# Patient Record
Sex: Male | Born: 1955 | Race: White | Hispanic: No | Marital: Married | Smoking: Never smoker
Health system: Southern US, Community
[De-identification: ages and names within clinical notes are randomized; demographics above are authoritative.]

## PROBLEM LIST (undated history)

## (undated) DIAGNOSIS — Z8719 Personal history of other diseases of the digestive system: Secondary | ICD-10-CM

## (undated) DIAGNOSIS — E785 Hyperlipidemia, unspecified: Secondary | ICD-10-CM

## (undated) DIAGNOSIS — F988 Other specified behavioral and emotional disorders with onset usually occurring in childhood and adolescence: Secondary | ICD-10-CM

## (undated) DIAGNOSIS — I4510 Unspecified right bundle-branch block: Secondary | ICD-10-CM

## (undated) DIAGNOSIS — E7800 Pure hypercholesterolemia, unspecified: Secondary | ICD-10-CM

## (undated) HISTORY — PX: APPENDECTOMY: SHX54

## (undated) HISTORY — DX: Pure hypercholesterolemia, unspecified: E78.00

## (undated) HISTORY — PX: SHOULDER SURGERY: SHX246

## (undated) HISTORY — DX: Unspecified right bundle-branch block: I45.10

## (undated) HISTORY — DX: Hyperlipidemia, unspecified: E78.5

## (undated) HISTORY — DX: Other specified behavioral and emotional disorders with onset usually occurring in childhood and adolescence: F98.8

## (undated) HISTORY — DX: Personal history of other diseases of the digestive system: Z87.19

---

## 2016-06-09 DIAGNOSIS — S060X9A Concussion with loss of consciousness of unspecified duration, initial encounter: Secondary | ICD-10-CM | POA: Diagnosis not present

## 2016-07-01 DIAGNOSIS — E781 Pure hyperglyceridemia: Secondary | ICD-10-CM | POA: Diagnosis not present

## 2016-07-01 DIAGNOSIS — Z0000 Encounter for general adult medical examination without abnormal findings: Secondary | ICD-10-CM | POA: Diagnosis not present

## 2016-07-01 DIAGNOSIS — Z125 Encounter for screening for malignant neoplasm of prostate: Secondary | ICD-10-CM | POA: Diagnosis not present

## 2016-07-01 DIAGNOSIS — F900 Attention-deficit hyperactivity disorder, predominantly inattentive type: Secondary | ICD-10-CM | POA: Diagnosis not present

## 2016-07-01 DIAGNOSIS — Z1159 Encounter for screening for other viral diseases: Secondary | ICD-10-CM | POA: Diagnosis not present

## 2017-01-02 DIAGNOSIS — F900 Attention-deficit hyperactivity disorder, predominantly inattentive type: Secondary | ICD-10-CM | POA: Diagnosis not present

## 2017-02-27 DIAGNOSIS — L247 Irritant contact dermatitis due to plants, except food: Secondary | ICD-10-CM | POA: Diagnosis not present

## 2017-07-10 DIAGNOSIS — Z13220 Encounter for screening for lipoid disorders: Secondary | ICD-10-CM | POA: Diagnosis not present

## 2017-07-10 DIAGNOSIS — M79602 Pain in left arm: Secondary | ICD-10-CM | POA: Diagnosis not present

## 2017-07-10 DIAGNOSIS — Z0000 Encounter for general adult medical examination without abnormal findings: Secondary | ICD-10-CM | POA: Diagnosis not present

## 2017-07-11 ENCOUNTER — Encounter: Payer: Self-pay | Admitting: Cardiology

## 2017-07-12 ENCOUNTER — Encounter: Payer: Self-pay | Admitting: Cardiology

## 2017-07-12 ENCOUNTER — Ambulatory Visit: Payer: BLUE CROSS/BLUE SHIELD | Admitting: Cardiology

## 2017-07-12 ENCOUNTER — Encounter (INDEPENDENT_AMBULATORY_CARE_PROVIDER_SITE_OTHER): Payer: Self-pay

## 2017-07-12 VITALS — BP 104/70 | HR 67 | Ht 73.0 in | Wt 179.2 lb

## 2017-07-12 DIAGNOSIS — M79602 Pain in left arm: Secondary | ICD-10-CM | POA: Insufficient documentation

## 2017-07-12 DIAGNOSIS — E7800 Pure hypercholesterolemia, unspecified: Secondary | ICD-10-CM | POA: Diagnosis not present

## 2017-07-12 DIAGNOSIS — I4510 Unspecified right bundle-branch block: Secondary | ICD-10-CM | POA: Diagnosis not present

## 2017-07-12 DIAGNOSIS — E785 Hyperlipidemia, unspecified: Secondary | ICD-10-CM

## 2017-07-12 HISTORY — DX: Unspecified right bundle-branch block: I45.10

## 2017-07-12 HISTORY — DX: Hyperlipidemia, unspecified: E78.5

## 2017-07-12 NOTE — Patient Instructions (Signed)
Medication Instructions:  Your physician recommends that you continue on your current medications as directed. Please refer to the Current Medication list given to you today.  Labwork: None ordered  Testing/Procedures: Your physician has requested that you have an exercise tolerance test. For further information please visit [email or web address] Please also follow instruction sheet, as given.  Your physician has requested that you have an echocardiogram. Echocardiography is a painless test that uses sound waves to create images of your heart. It provides your doctor with information about the size and shape of your heart and how well your heart's chambers and valves are working. This procedure takes approximately one hour. There are no restrictions for this procedure.  Regular calcium score done here at our CT department   Follow-Up: Follow up as needed with Dr. Mayford Knifeurner.   Any Other Special Instructions Will Be Listed Below (If Applicable).     If you need a refill on your cardiac medications before your next appointment, please call your pharmacy.

## 2017-07-12 NOTE — Progress Notes (Signed)
Cardiology Office Note    Date:  07/12/2017   ID:  Marcus Harvey, DOB 01/10/1956, MRN [id number]  PCP:  Sigmund HazelMiller, Lisa, MD  Cardiologist:  Armanda Magicraci Turner, MD   Chief Complaint  Patient presents with  . New Patient (Initial Visit)    left arm pain    History of Present Illness:  Marcus KohWilliam Harvey is a 61 y.o. male who is being seen today for the evaluation of exertional left arm pain  at the request of Sigmund HazelLisa Miller MD.  The patient has a history of hyperlipidemia mainly hypertriglyceridemia, ADD and Raynauds and has been having problems with     Past Medical History:  Diagnosis Date  . ADD (attention deficit disorder)   . Hx of hemorrhoids   . Hypercholesterolemia    high TG 1/10, 1/12, 3/13, 3/15, high LDL, TG consider statin ASCVD risk 7-5%, usual risk 3.6%, risk 6%, LDL. 168 11/17, recheck 6 mos.   . Hyperlipidemia 07/12/2017  . Incomplete RBBB 07/12/2017    Past Surgical History:  Procedure Laterality Date  . APPENDECTOMY    . SHOULDER SURGERY Left    multiple dislocations     Current Medications: Current Meds  Medication Sig  . lisdexamfetamine (VYVANSE) 30 MG capsule Take 30 mg daily as needed by mouth (ADD).     Allergies:   Patient has no known allergies.   Social History   Socioeconomic History  . Marital status: Married    Spouse name: None  . Number of children: 2  . Years of education: college   . Highest education level: None  Social Needs  . Financial resource strain: None  . Food insecurity - worry: None  . Food insecurity - inability: None  . Transportation needs - medical: None  . Transportation needs - non-medical: None  Occupational History  . Occupation: Marketing executivesales contracting/business owner  Tobacco Use  . Smoking status: Never Smoker  . Smokeless tobacco: Never Used  Substance and Sexual Activity  . Alcohol use: Yes  . Drug use: No  . Sexual activity: None  Other Topics Concern  . None  Social History Narrative  . None      Family History:  The patient's family history includes Cancer (age of onset: 6053) in his brother; Diabetes Mellitus I in his paternal grandfather.   ROS:   Please see the history of present illness.    ROS All other systems reviewed and are negative.  No flowsheet data found.     PHYSICAL EXAM:   VS:  BP 104/70   Pulse 67   Ht 6\' 1"  (1.854 m)   Wt 179 lb 3.2 oz (81.3 kg)   BMI 23.64 kg/m    GEN: Well nourished, well developed, in no acute distress  HEENT: normal  Neck: no JVD, carotid bruits, or masses Cardiac: RRR; no murmurs, rubs, or gallops,no edema.  Intact distal pulses bilaterally.  Respiratory:  clear to auscultation bilaterally, normal work of breathing GI: soft, nontender, nondistended, + BS MS: no deformity or atrophy  Skin: warm and dry, no rash Neuro:  Alert and Oriented x 3, Strength and sensation are intact Psych: euthymic mood, full affect  Wt Readings from Last 3 Encounters:  07/12/17 179 lb 3.2 oz (81.3 kg)      Studies/Labs Reviewed:   EKG:  EKG is ordered today.  The ekg ordered today demonstrates NSR at 67bpm with IRBBB  Recent Labs: No results found for requested labs within last 8760 hours.   Lipid  Panel No results found for: CHOL, TRIG, HDL, CHOLHDL, VLDL, LDLCALC, LDLDIRECT  Additional studies/ records that were reviewed today include:  Office notes from PCP    ASSESSMENT:    1. Left arm pain   2. Right bundle branch block   3. Pure hypercholesterolemia      PLAN:  In order of problems listed above:  1. Left arm pain - unclear etiology.  He has had shoulder surgery with a pin in the past.  The concerning component is that it is exertional although no associated CP or SOB.  His EKG shows an IRBBB.  He does not smoke and has no family history of premature CAD but does have hyperlipidemia - I will get an ETT to rule out ischemia and get a Chest Ct calcium score to assess future risk.   2.  IRBBB - I will get a 2D echo to rule out  structural heart disease.  3.  Hyperlipidemia - total chol 256 with LDL 168.  His current Framingham 6163yr risk is 7% but if calcium score is elevated it will be > 7.5%. If calcium score is 0 then I have told him I am fine with him trying diet for 4-6 months and repeating lipids prior to starting statin as he knows the bad foods he is eating and wants to change his diet.  If he has significant coronary calcium then I would recommend that he start the statin.     Medication Adjustments/Labs and Tests Ordered: Current medicines are reviewed at length with the patient today.  Concerns regarding medicines are outlined above.  Medication changes, Labs and Tests ordered today are listed in the Patient Instructions below.  There are no Patient Instructions on file for this visit.   Signed, Armanda Magicraci Turner, MD  07/12/2017 2:49 PM    [facility] [address], [place], [place]  [address] Phone: [phone]; Fax: [phone]

## 2017-07-27 ENCOUNTER — Ambulatory Visit (HOSPITAL_COMMUNITY): Payer: BLUE CROSS/BLUE SHIELD | Attending: Cardiovascular Disease

## 2017-07-27 ENCOUNTER — Ambulatory Visit (INDEPENDENT_AMBULATORY_CARE_PROVIDER_SITE_OTHER)
Admission: RE | Admit: 2017-07-27 | Discharge: 2017-07-27 | Disposition: A | Discharge status: Home / Self Care | Payer: Self-pay | Source: Ambulatory Visit | Attending: Cardiology | Admitting: Cardiology

## 2017-07-27 ENCOUNTER — Other Ambulatory Visit: Payer: Self-pay

## 2017-07-27 ENCOUNTER — Ambulatory Visit (INDEPENDENT_AMBULATORY_CARE_PROVIDER_SITE_OTHER): Payer: BLUE CROSS/BLUE SHIELD

## 2017-07-27 DIAGNOSIS — I10 Essential (primary) hypertension: Secondary | ICD-10-CM | POA: Insufficient documentation

## 2017-07-27 DIAGNOSIS — Z8249 Family history of ischemic heart disease and other diseases of the circulatory system: Secondary | ICD-10-CM | POA: Diagnosis not present

## 2017-07-27 DIAGNOSIS — M79602 Pain in left arm: Secondary | ICD-10-CM | POA: Diagnosis not present

## 2017-07-27 DIAGNOSIS — I4510 Unspecified right bundle-branch block: Secondary | ICD-10-CM | POA: Insufficient documentation

## 2017-07-27 DIAGNOSIS — E7800 Pure hypercholesterolemia, unspecified: Secondary | ICD-10-CM

## 2017-07-27 DIAGNOSIS — E785 Hyperlipidemia, unspecified: Secondary | ICD-10-CM | POA: Diagnosis not present

## 2017-07-28 LAB — EXERCISE TOLERANCE TEST
CHL CUP MPHR: 161 {beats}/min
CHL CUP RESTING HR STRESS: 54 {beats}/min
CHL CUP STRESS STAGE 1 DBP: 87 mmHg
CHL CUP STRESS STAGE 1 GRADE: 0 %
CHL CUP STRESS STAGE 1 SPEED: 0 mph
CHL CUP STRESS STAGE 10 HR: 80 {beats}/min
CHL CUP STRESS STAGE 2 GRADE: 0 %
CHL CUP STRESS STAGE 2 HR: 67 {beats}/min
CHL CUP STRESS STAGE 3 GRADE: 0.1 %
CHL CUP STRESS STAGE 3 HR: 67 {beats}/min
CHL CUP STRESS STAGE 4 SPEED: 1.7 mph
CHL CUP STRESS STAGE 5 GRADE: 12 %
CHL CUP STRESS STAGE 5 HR: 95 {beats}/min
CHL CUP STRESS STAGE 5 SPEED: 2.5 mph
CHL CUP STRESS STAGE 6 DBP: 80 mmHg
CHL CUP STRESS STAGE 6 GRADE: 14 %
CHL CUP STRESS STAGE 6 HR: 107 {beats}/min
CHL CUP STRESS STAGE 6 SBP: 187 mmHg
CHL CUP STRESS STAGE 6 SPEED: 3.4 mph
CHL CUP STRESS STAGE 7 GRADE: 16 %
CHL CUP STRESS STAGE 7 HR: 125 {beats}/min
CHL CUP STRESS STAGE 7 SBP: 205 mmHg
CHL CUP STRESS STAGE 8 HR: 127 {beats}/min
CHL CUP STRESS STAGE 8 SPEED: 5 mph
CHL CUP STRESS STAGE 9 DBP: 99 mmHg
CHL CUP STRESS STAGE 9 SPEED: 0 mph
CSEPPMHR: 78 %
Estimated workload: 15.1 METS
Exercise duration (min): 12 min
Exercise duration (sec): 54 s
Peak HR: 127 {beats}/min
Percent HR: 93 %
RPE: 15
Stage 1 HR: 60 {beats}/min
Stage 1 SBP: 126 mmHg
Stage 10 DBP: 91 mmHg
Stage 10 Grade: 0 %
Stage 10 SBP: 155 mmHg
Stage 10 Speed: 0 mph
Stage 2 Speed: 1 mph
Stage 3 Speed: 1 mph
Stage 4 DBP: 81 mmHg
Stage 4 Grade: 10 %
Stage 4 HR: 81 {beats}/min
Stage 4 SBP: 134 mmHg
Stage 5 DBP: 84 mmHg
Stage 5 SBP: 159 mmHg
Stage 7 DBP: 98 mmHg
Stage 7 Speed: 4.2 mph
Stage 8 Grade: 18 %
Stage 9 Grade: 0 %
Stage 9 HR: 121 {beats}/min
Stage 9 SBP: 201 mmHg

## 2017-08-02 ENCOUNTER — Telehealth: Payer: Self-pay

## 2017-08-02 DIAGNOSIS — I719 Aortic aneurysm of unspecified site, without rupture: Secondary | ICD-10-CM

## 2017-08-02 DIAGNOSIS — I712 Thoracic aortic aneurysm, without rupture, unspecified: Secondary | ICD-10-CM

## 2017-08-02 NOTE — Progress Notes (Signed)
Normal stress test

## 2017-08-02 NOTE — Telephone Encounter (Signed)
Notes recorded by Phineas Semenobertson, Ozro Russett, RN on 08/02/2017 at 1:35 PM EST Reviewed results with patient and informed him that Dr. Mayford Knifeurner recommends a MRI/MRA of chest and abdomen for further assess of aortic aneurysm. Patient informed that he will be contacted to make that appointment. Patient verbalized understanding and thanked me for the call. Most recent copy of lipids on file.   Notes recorded by Quintella Reicherturner, Traci R, MD on 08/02/2017 at 5:02 AM EST Please let patient know that he has a moderated aortic aneurysm measuring 44mm- please get dedicated MRI/MRA of chest and abdomen to assess further- calcium score mildly elevated - please get copy of last lipids to review

## 2017-08-14 ENCOUNTER — Telehealth: Payer: Self-pay | Admitting: Cardiology

## 2017-08-14 NOTE — Telephone Encounter (Signed)
New message    Pt would like to switch providers from Dr. Mayford Knifeurner to Dr. Elease HashimotoNahser. Is this ok?

## 2017-08-14 NOTE — Telephone Encounter (Signed)
No problem I am fine with that

## 2017-08-14 NOTE — Telephone Encounter (Signed)
I've known Bill for many years. I would be happy to see him if OK with Dr. Mayford Knifeurner.

## 2017-08-16 ENCOUNTER — Telehealth: Payer: Self-pay | Admitting: *Deleted

## 2017-08-16 ENCOUNTER — Encounter: Payer: Self-pay | Admitting: *Deleted

## 2017-08-16 DIAGNOSIS — I712 Thoracic aortic aneurysm, without rupture, unspecified: Secondary | ICD-10-CM

## 2017-08-16 NOTE — Telephone Encounter (Signed)
Dr. Elease HashimotoNahser s/w pt and went over results by phone with verbal understanding. Pt agreeable to plan of care per Dr. Elease HashimotoNahser to schedule CT-A. I will place order and have CT dept call and schedule. Per Dr. Elease HashimotoNahser pt would like to have this done before Christmas.

## 2017-08-16 NOTE — Telephone Encounter (Signed)
This encounter was created in error - please disregard.

## 2017-08-17 ENCOUNTER — Ambulatory Visit (INDEPENDENT_AMBULATORY_CARE_PROVIDER_SITE_OTHER)
Admission: RE | Admit: 2017-08-17 | Discharge: 2017-08-17 | Disposition: A | Discharge status: Home / Self Care | Payer: BLUE CROSS/BLUE SHIELD | Source: Ambulatory Visit | Attending: Cardiovascular Disease | Admitting: Cardiovascular Disease

## 2017-08-17 DIAGNOSIS — I712 Thoracic aortic aneurysm, without rupture, unspecified: Secondary | ICD-10-CM

## 2017-08-17 MED ORDER — IOPAMIDOL (ISOVUE-370) INJECTION 76%
100.0000 mL | Freq: Once | INTRAVENOUS | Status: AC | PRN
  Administered 2017-08-17: 100 mL via INTRAVENOUS

## 2017-08-18 ENCOUNTER — Other Ambulatory Visit: Payer: BLUE CROSS/BLUE SHIELD

## 2017-09-04 ENCOUNTER — Encounter: Payer: Self-pay | Admitting: Cardiovascular Disease

## 2017-09-04 ENCOUNTER — Ambulatory Visit: Payer: BLUE CROSS/BLUE SHIELD | Admitting: Cardiovascular Disease

## 2017-09-04 VITALS — BP 132/68 | HR 72 | Ht 73.0 in | Wt 185.8 lb

## 2017-09-04 DIAGNOSIS — I7121 Aneurysm of the ascending aorta, without rupture: Secondary | ICD-10-CM

## 2017-09-04 DIAGNOSIS — E782 Mixed hyperlipidemia: Secondary | ICD-10-CM

## 2017-09-04 DIAGNOSIS — I712 Thoracic aortic aneurysm, without rupture: Secondary | ICD-10-CM | POA: Diagnosis not present

## 2017-09-04 MED ORDER — ATORVASTATIN CALCIUM 40 MG PO TABS: 40.0000 mg | ORAL_TABLET | Freq: Every day | ORAL | 3 refills | Status: DC

## 2017-09-04 NOTE — Patient Instructions (Addendum)
Medication Instructions:  Your physician has recommended you make the following change in your medication:  INCREASE Atorvastatin to 40 mg daily   Labwork: Your physician recommends that you return for lab work in: 3 months on Tuesday December 06, 2017 You will need to FAST for this appointment - nothing to eat or drink after midnight the night before except water.   Testing/Procedures: None Ordered   Follow-Up: Your physician wants you to follow-up in: 6 months with Dr. Elease HashimotoNahser.  You will receive a reminder letter in the mail two months in advance. If you don't receive a letter, please call our office to schedule the follow-up appointment.   If you need a refill on your cardiac medications before your next appointment, please call your pharmacy.   Thank you for choosing [facility]! Eligha BridegroomMichelle Vence Lalor, RN [phone]

## 2017-09-04 NOTE — Progress Notes (Signed)
Cardiology Office Note:    Date:  09/04/2017   ID:  Marcus Harvey, DOB 02/27/1956, MRN [id number]  PCP:  Sigmund HazelMiller, Lisa, MD  Cardiologist:  Kristeen MissPhilip Carlisha Wisler, MD    Referring MD: Sigmund HazelMiller, Lisa, MD   Problem list 1.  Left arm pain 2.  Hyperlipidemia 3.  Ascending aortic dilatation 4.  Right bundle branch block  Chief Complaint  Patient presents with  . Follow-up    ascending aortic dilitation  . Hyperlipidemia    History of Present Illness:    Marcus KohWilliam Harvey is a [age] y.o. male with a hx of hyperlipidemia.  He originally saw Dr. Mayford Knifeurner for an atypical episode of chest pain.  Coronary CT angiogram revealed a coronary calcium score of 38.  This is in the 56th percentile for age and sex.  He had dilatation of the ascending aorta (44 mm) .  Follow-up CT angiogram showed that the thoracic aortic aneurysm was 4.2 cm.  There was no evidence of dissection.  Fasting labs performed at his medical doctor's office reveals: Total cholesterol = 227 Triglyceride level = 186 HDL = 47 LDL = 142  Has noticed a feeling in his chest  - not a pain ,  Thinks it could be anxiety  Has not had   Past Medical History:  Diagnosis Date  . ADD (attention deficit disorder)   . Hx of hemorrhoids   . Hypercholesterolemia    high TG 1/10, 1/12, 3/13, 3/15, high LDL, TG consider statin ASCVD risk 7-5%, usual risk 3.6%, risk 6%, LDL. 168 11/17, recheck 6 mos.   . Hyperlipidemia 07/12/2017  . Incomplete RBBB 07/12/2017    Past Surgical History:  Procedure Laterality Date  . APPENDECTOMY    . SHOULDER SURGERY Left    multiple dislocations     Current Medications: Current Meds  Medication Sig  . atorvastatin (LIPITOR) 10 MG tablet Take 10 mg by mouth daily.  Marland Kitchen. lisdexamfetamine (VYVANSE) 30 MG capsule Take 30 mg daily as needed by mouth (ADD).      Allergies:   Patient has no known allergies.   Social History   Socioeconomic History  . Marital status: Married    Spouse name: None  . Number of  children: 2  . Years of education: college   . Highest education level: None  Social Needs  . Financial resource strain: None  . Food insecurity - worry: None  . Food insecurity - inability: None  . Transportation needs - medical: None  . Transportation needs - non-medical: None  Occupational History  . Occupation: Marketing executivesales contracting/business owner  Tobacco Use  . Smoking status: Never Smoker  . Smokeless tobacco: Never Used  Substance and Sexual Activity  . Alcohol use: Yes  . Drug use: No  . Sexual activity: None  Other Topics Concern  . None  Social History Narrative  . None     Family History: The patient's family history includes Cancer (age of onset: 453) in his brother; Diabetes Mellitus I in his paternal grandfather. ROS:   Please see the history of present illness.     All other systems reviewed and are negative.  EKGs/Labs/Other Studies Reviewed:       EKG:     Recent Labs: No results found for requested labs within last 8760 hours.  Recent Lipid Panel No results found for: CHOL, TRIG, HDL, CHOLHDL, VLDL, LDLCALC, LDLDIRECT  Physical Exam:    VS:  BP 132/68   Pulse 72   Ht 6\' 1"  (1.854  m)   Wt 185 lb 12.8 oz (84.3 kg)   SpO2 97%   BMI 24.51 kg/m     Wt Readings from Last 3 Encounters:  09/04/17 185 lb 12.8 oz (84.3 kg)  07/12/17 179 lb 3.2 oz (81.3 kg)     GEN:  Well nourished, well developed in no acute distress HEENT: Normal NECK: No JVD; No carotid bruits LYMPHATICS: No lymphadenopathy CARDIAC: RR  no murmurs, rubs, gallops RESPIRATORY:  Clear to auscultation without rales, wheezing or rhonchi  ABDOMEN: Soft, non-tender, non-distended MUSCULOSKELETAL:  No edema; No deformity  SKIN: Warm and dry NEUROLOGIC:  Alert and oriented x 3 PSYCHIATRIC:  Normal affect   ASSESSMENT:    No diagnosis found. PLAN:    In order of problems listed above:  1.   Ascending aortid dilitation - very minimal 4.2 cm  Will recheck in 12 months  Given him  the okay to exercise on a regular basis.  Of advised him to avoid weight lifting. Discussed low dose beta blocker .   He still eats some occasional salt.  We will have him decrease his salt intake.  2.  Hyperlipidemia-he has evidence of coronary artery calcifications.  I think he needs an LDL of 70 or below.  Will increase the atorvastatin to 40 mg a day and recheck labs in 3 months.  3. Coronary Calcium -he has evidence of coronary calcifications.  He has no angina.  Continue aggressive treatment of his lipids.  He will continue to exercise.   Medication Adjustments/Labs and Tests Ordered: Current medicines are reviewed at length with the patient today.  Concerns regarding medicines are outlined above.  No orders of the defined types were placed in this encounter.  No orders of the defined types were placed in this encounter.   Signed, Kristeen Miss, MD  09/04/2017 11:11 AM    [facility]

## 2017-12-06 ENCOUNTER — Other Ambulatory Visit: Payer: Self-pay | Admitting: Nurse Practitioner

## 2017-12-06 ENCOUNTER — Other Ambulatory Visit: Payer: BLUE CROSS/BLUE SHIELD | Admitting: *Deleted

## 2017-12-06 DIAGNOSIS — I712 Thoracic aortic aneurysm, without rupture: Secondary | ICD-10-CM | POA: Diagnosis not present

## 2017-12-06 DIAGNOSIS — E782 Mixed hyperlipidemia: Secondary | ICD-10-CM | POA: Diagnosis not present

## 2017-12-06 DIAGNOSIS — R739 Hyperglycemia, unspecified: Secondary | ICD-10-CM

## 2017-12-06 DIAGNOSIS — I7121 Aneurysm of the ascending aorta, without rupture: Secondary | ICD-10-CM

## 2017-12-06 LAB — BASIC METABOLIC PANEL
BUN/Creatinine Ratio: 14 (ref 9–20)
BUN: 13 mg/dL (ref 6–24)
CO2: 26 mmol/L (ref 20–29)
Calcium: 9.2 mg/dL (ref 8.7–10.2)
Chloride: 96 mmol/L (ref 96–106)
Creatinine, Ser: 0.91 mg/dL (ref 0.76–1.27)
GFR calc Af Amer: 106 mL/min/{1.73_m2} (ref >59)
GFR calc non Af Amer: 92 mL/min/{1.73_m2} (ref >59)
Glucose: 192 mg/dL — ABNORMAL HIGH (ref 65–99)
Potassium: 4.1 mmol/L (ref 3.5–5.2)
Sodium: 140 mmol/L (ref 134–144)

## 2017-12-06 LAB — LIPID PANEL
Chol/HDL Ratio: 3.1 ratio (ref 0.0–5.0)
Cholesterol, Total: 159 mg/dL (ref 100–199)
HDL: 52 mg/dL (ref >39)
LDL CALC: 72 mg/dL (ref 0–99)
Triglycerides: 177 mg/dL — ABNORMAL HIGH (ref 0–149)
VLDL CHOLESTEROL CAL: 35 mg/dL (ref 5–40)

## 2017-12-06 LAB — HEPATIC FUNCTION PANEL
ALBUMIN: 4.7 g/dL (ref 3.5–5.5)
ALT: 35 IU/L (ref 0–44)
AST: 35 IU/L (ref 0–40)
Alkaline Phosphatase: 84 IU/L (ref 39–117)
BILIRUBIN TOTAL: 0.7 mg/dL (ref 0.0–1.2)
BILIRUBIN, DIRECT: 0.21 mg/dL (ref 0.00–0.40)
Total Protein: 6.9 g/dL (ref 6.0–8.5)

## 2017-12-08 ENCOUNTER — Encounter (INDEPENDENT_AMBULATORY_CARE_PROVIDER_SITE_OTHER): Payer: Self-pay

## 2017-12-08 ENCOUNTER — Other Ambulatory Visit: Payer: BLUE CROSS/BLUE SHIELD

## 2017-12-08 DIAGNOSIS — R739 Hyperglycemia, unspecified: Secondary | ICD-10-CM

## 2017-12-09 LAB — HEMOGLOBIN A1C
ESTIMATED AVERAGE GLUCOSE: 137 mg/dL
HEMOGLOBIN A1C: 6.4 % — AB (ref 4.8–5.6)

## 2017-12-12 DIAGNOSIS — R7303 Prediabetes: Secondary | ICD-10-CM | POA: Diagnosis not present

## 2017-12-12 DIAGNOSIS — Z6826 Body mass index (BMI) 26.0-26.9, adult: Secondary | ICD-10-CM | POA: Diagnosis not present

## 2018-01-12 DIAGNOSIS — F900 Attention-deficit hyperactivity disorder, predominantly inattentive type: Secondary | ICD-10-CM | POA: Diagnosis not present

## 2018-01-12 DIAGNOSIS — R7303 Prediabetes: Secondary | ICD-10-CM | POA: Diagnosis not present

## 2018-01-12 DIAGNOSIS — E781 Pure hyperglyceridemia: Secondary | ICD-10-CM | POA: Diagnosis not present

## 2018-02-26 IMAGING — CT CT ANGIO CHEST
2 of 7 series · 19 of 46 positions shown · IV contrast (iopamidol)
Comparison: None.

CLINICAL DATA: Thoracic aortic aneurysm without rupture.

EXAM:
CT ANGIOGRAPHY CHEST WITH CONTRAST
TECHNIQUE: Multidetector CT imaging of the chest was performed using the
standard protocol during bolus administration of intravenous
contrast. Multiplanar CT image reconstructions and MIPs were
obtained to evaluate the vascular anatomy.
CONTRAST:  100mL O0XFS7-N3G IOPAMIDOL (O0XFS7-N3G) INJECTION 76%

[Series 4: aorta 3.0 i31f 2 · axial · 0.79mm/px · z∈[-357,-30]mm · 16 of 119 slices shown]
[im 5/119  lung]
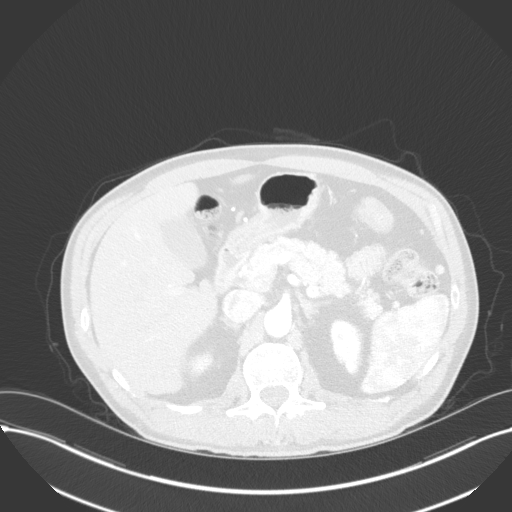
[im 14/119  soft-tissue]
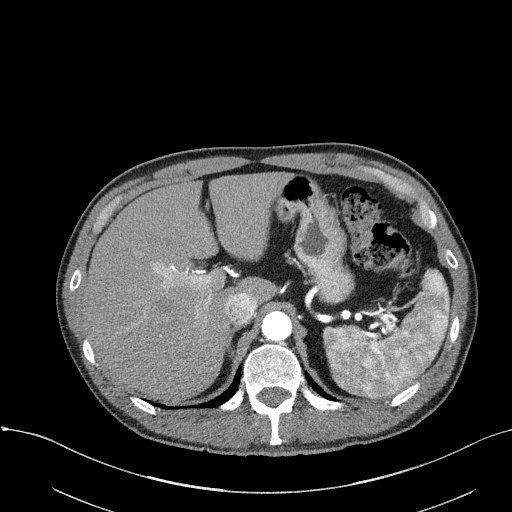
[im 22/119  lung]
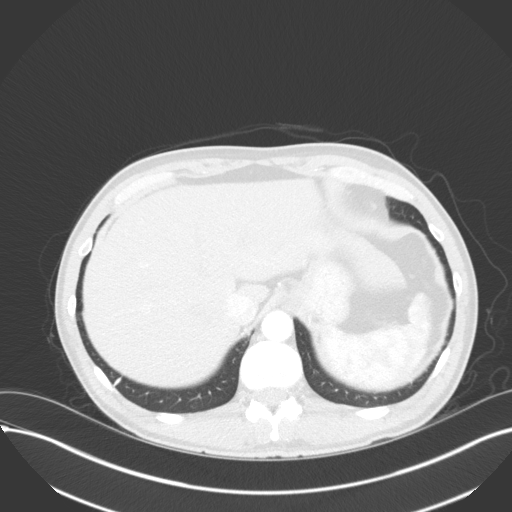
[im 27/119  soft-tissue]
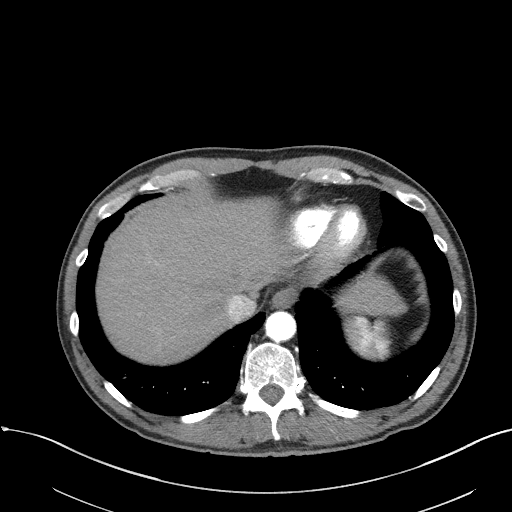
[im 35/119  lung]
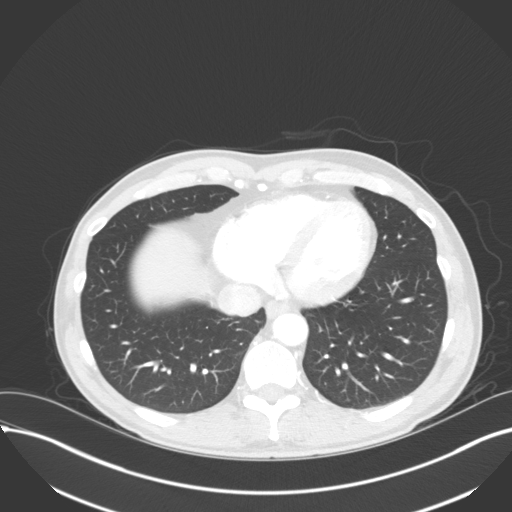
[im 40/119  soft-tissue]
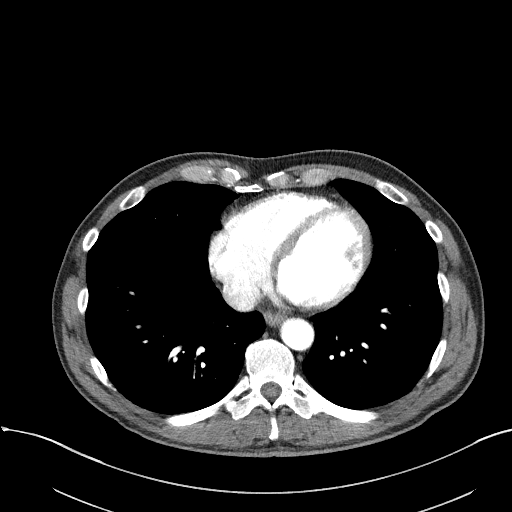
[im 49/119  lung]
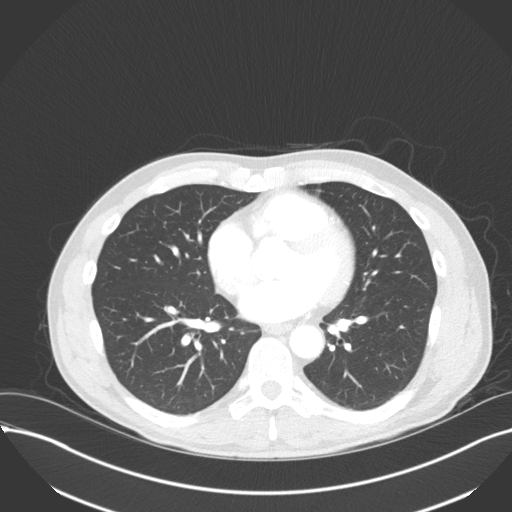
[im 57/119  soft-tissue]
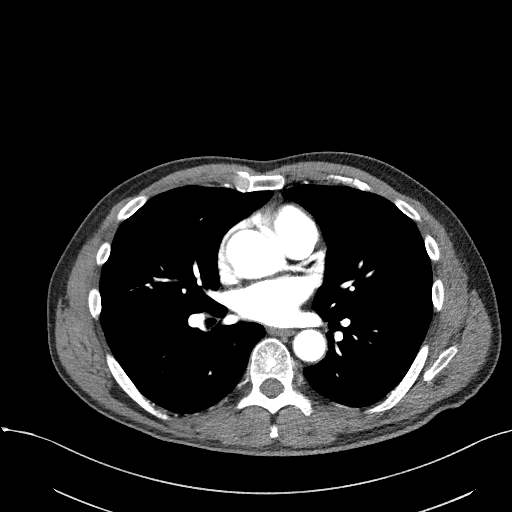
[im 62/119  lung]
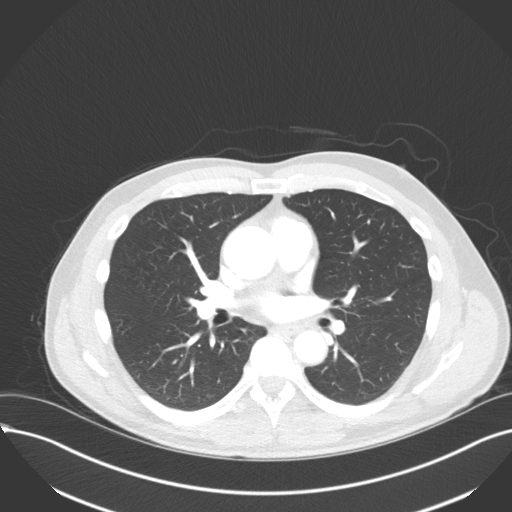
[im 70/119  soft-tissue]
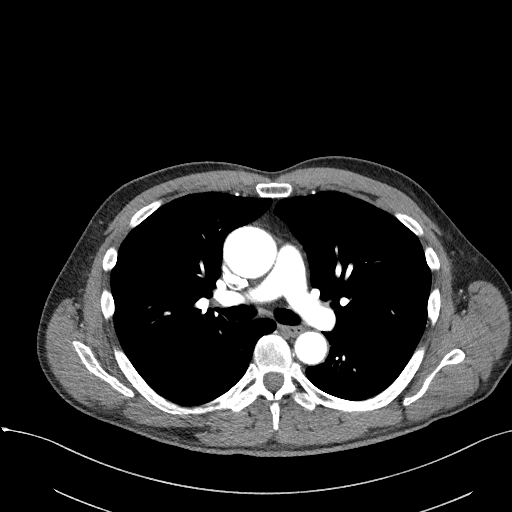
[im 79/119  lung]
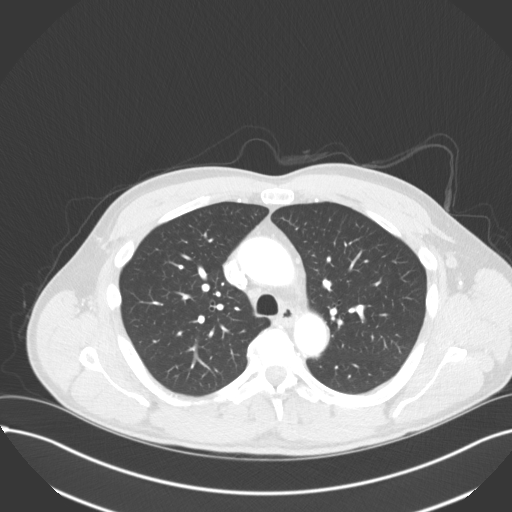
[im 84/119  soft-tissue]
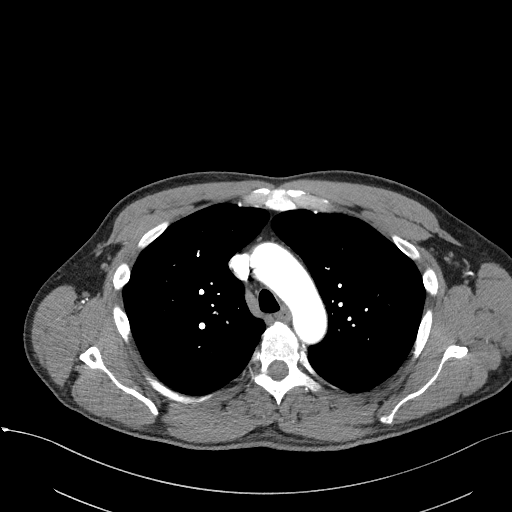
[im 92/119  lung]
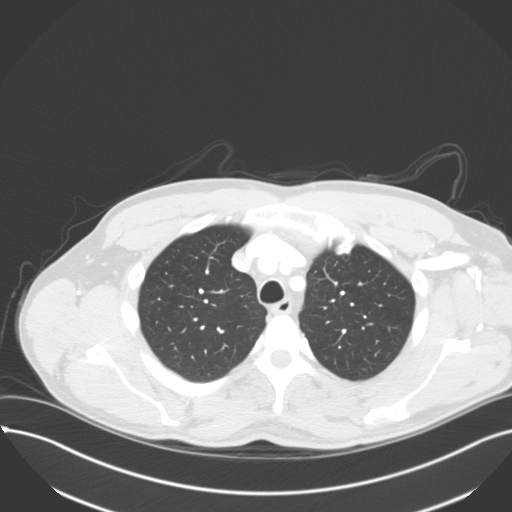
[im 97/119  soft-tissue]
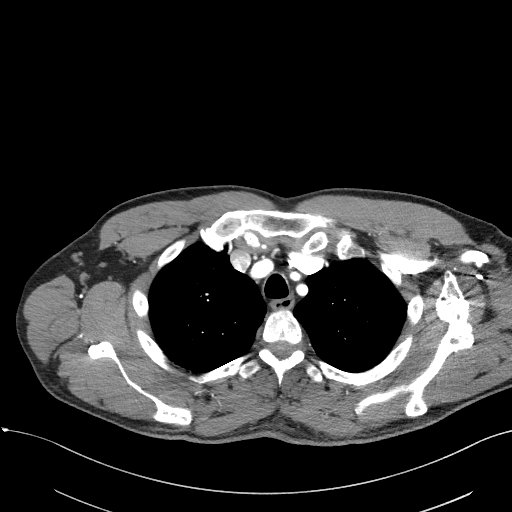
[im 105/119  lung]
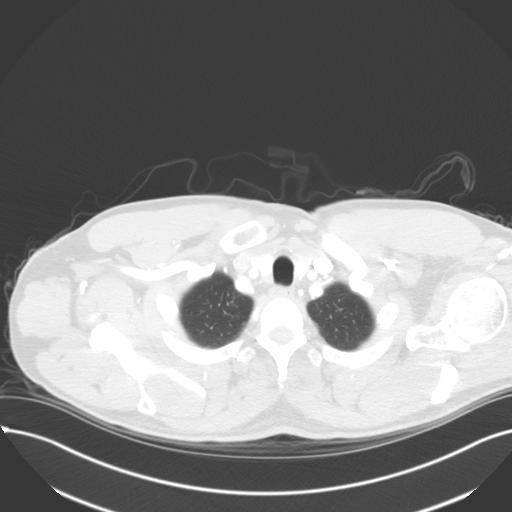
[im 114/119  soft-tissue]
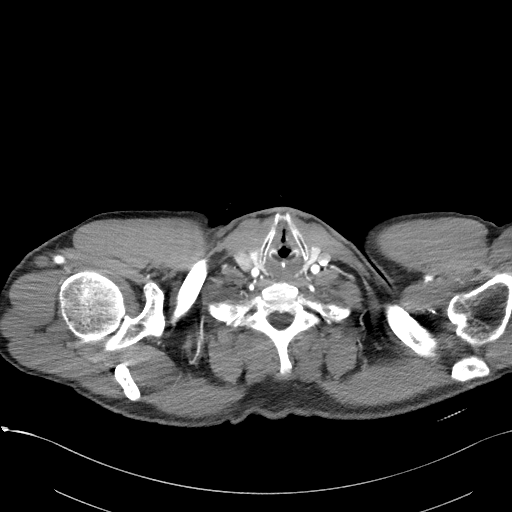

[Series 7: coronals · coronal · 0.67mm/px · 3 of 110 slices shown]
[im 28/110  soft-tissue]
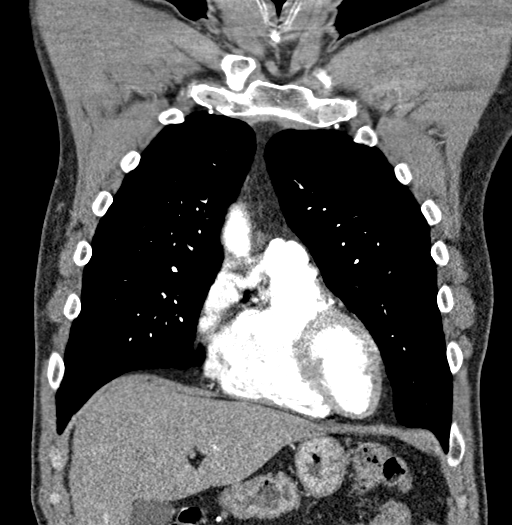
[im 55/110  soft-tissue]
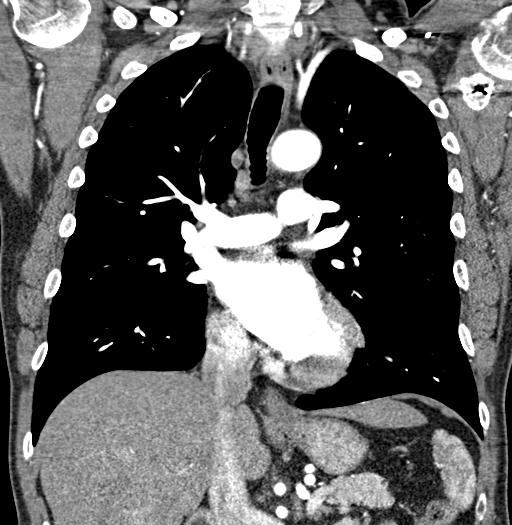
[im 82/110  soft-tissue]
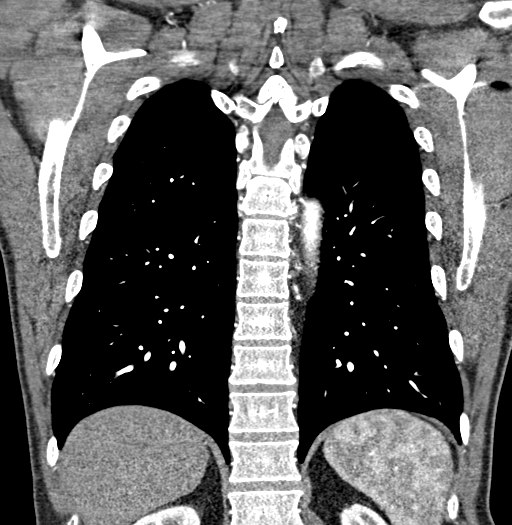

[19 of 46 positions shown; findings below may reference images not displayed]

FINDINGS: Cardiovascular: 4.2 cm ascending thoracic aortic aneurysm is noted
without dissection. Transverse aortic arch measures 2.8 cm in
diameter. Proximal descending thoracic aorta measures 2.5 cm. Great
vessels are widely patent without significant stenosis. Normal
cardiac size. No pericardial effusion is noted.

Mediastinum/Nodes: No enlarged mediastinal, hilar, or axillary lymph
nodes. Thyroid gland, trachea, and esophagus demonstrate no
significant findings.

Lungs/Pleura: Lungs are clear. No pleural effusion or pneumothorax.

Upper Abdomen: No acute abnormality.

Musculoskeletal: No chest wall abnormality. No acute or significant
osseous findings.

Review of the MIP images confirms the above findings.
IMPRESSION: 4.2 cm ascending thoracic aortic aneurysm. Recommend annual imaging
followup by CTA or MRA. This recommendation follows 8909
ACCF/AHA/AATS/ACR/ASA/SCA/MAREDIA/ROOS/KLEVER/GARNETT Guidelines for the
Diagnosis and Management of Patients with Thoracic Aortic Disease.
Circulation. 8909; 121: e266-e369.

## 2018-03-13 ENCOUNTER — Ambulatory Visit: Payer: BLUE CROSS/BLUE SHIELD | Admitting: Cardiovascular Disease

## 2018-03-13 ENCOUNTER — Encounter: Payer: Self-pay | Admitting: Cardiovascular Disease

## 2018-03-13 ENCOUNTER — Encounter (INDEPENDENT_AMBULATORY_CARE_PROVIDER_SITE_OTHER): Payer: Self-pay

## 2018-03-13 VITALS — BP 130/60 | HR 61 | Ht 73.0 in | Wt 179.0 lb

## 2018-03-13 DIAGNOSIS — E782 Mixed hyperlipidemia: Secondary | ICD-10-CM | POA: Diagnosis not present

## 2018-03-13 DIAGNOSIS — I712 Thoracic aortic aneurysm, without rupture, unspecified: Secondary | ICD-10-CM

## 2018-03-13 MED ORDER — FENOFIBRATE 145 MG PO TABS: 145.0000 mg | ORAL_TABLET | Freq: Every day | ORAL | 3 refills | Status: DC

## 2018-03-13 NOTE — Progress Notes (Signed)
Cardiology Office Note:    Date:  03/13/2018   ID:  Marcus Harvey, DOB 12/04/1955, MRN [id number]  PCP:  Sigmund HazelMiller, Lisa, MD  Cardiologist:  Kristeen MissPhilip Harshith Pursell, MD    Referring MD: Sigmund HazelMiller, Lisa, MD   Problem list 1.  Left arm pain 2.  Hyperlipidemia 3.  Ascending aortic dilatation 4.  Right bundle branch block  No chief complaint on file.   History of Present Illness:    Marcus Harvey is a 62 y.o. male with a hx of hyperlipidemia.  He originally saw Dr. Mayford Knifeurner for an atypical episode of chest pain.  Coronary CT angiogram revealed a coronary calcium score of 38.  This is in the 56th percentile for age and sex.  He had dilatation of the ascending aorta (44 mm) .  Follow-up CT angiogram showed that the thoracic aortic aneurysm was 4.2 cm.  There was no evidence of dissection.  Fasting labs performed at his medical doctor's office reveals: Total cholesterol = 227 Triglyceride level = 186 HDL = 47 LDL = 142  Has noticed a feeling in his chest  - not a pain ,  Thinks it could be anxiety  Has not had   March 13, 2018: Seen today for follow-up of his hyperlipidemia and mild aortic dilatation, and right bundle branch block. Marcus Harvey is done well.  Is not had any symptoms of chest pain or shortness of breath.  He continues to exercise and is very active.  Echocardiogram performed November, 2018 reveals normal left ventricular systolic function.  EF 60 to 65%.  He has grade 1 diastolic dysfunction. He had a regular treadmill test during which he walked for 12 minutes and 54 seconds.  He had frequent premature ventricular contractions and a 5 beat run of nonsustained VT.  Otherwise the exercise test was negative. Chest CT showed a small 4.2 cm thoracic aortic aneurysm . Recommended repeat in 12 months   Exercising without any issues    Past Medical History:  Diagnosis Date  . ADD (attention deficit disorder)   . Hx of hemorrhoids   . Hypercholesterolemia    high TG 1/10, 1/12, 3/13, 3/15,  high LDL, TG consider statin ASCVD risk 7-5%, usual risk 3.6%, risk 6%, LDL. 168 11/17, recheck 6 mos.   . Hyperlipidemia 07/12/2017  . Incomplete RBBB 07/12/2017    Past Surgical History:  Procedure Laterality Date  . APPENDECTOMY    . SHOULDER SURGERY Left    multiple dislocations     Current Medications: Current Meds  Medication Sig  . atorvastatin (LIPITOR) 40 MG tablet Take 1 tablet (40 mg total) by mouth daily.     Allergies:   Patient has no known allergies.   Social History   Socioeconomic History  . Marital status: Married    Spouse name: Not on file  . Number of children: 2  . Years of education: college   . Highest education level: Not on file  Occupational History  . Occupation: Marketing executivesales contracting/business owner  Social Needs  . Financial resource strain: Not on file  . Food insecurity:    Worry: Not on file    Inability: Not on file  . Transportation needs:    Medical: Not on file    Non-medical: Not on file  Tobacco Use  . Smoking status: Never Smoker  . Smokeless tobacco: Never Used  Substance and Sexual Activity  . Alcohol use: Yes  . Drug use: No  . Sexual activity: Not on file  Lifestyle  .  Physical activity:    Days per week: Not on file    Minutes per session: Not on file  . Stress: Not on file  Relationships  . Social connections:    Talks on phone: Not on file    Gets together: Not on file    Attends religious service: Not on file    Active member of club or organization: Not on file    Attends meetings of clubs or organizations: Not on file    Relationship status: Not on file  Other Topics Concern  . Not on file  Social History Narrative  . Not on file     Family History: The patient's family history includes Cancer (age of onset: 67) in his brother; Diabetes Mellitus I in his paternal grandfather. ROS:   Please see the history of present illness.     All other systems reviewed and are negative.  EKGs/Labs/Other Studies  Reviewed:       EKG:     Recent Labs: 12/06/2017: ALT 35; BUN 13; Creatinine, Ser 0.91; Potassium 4.1; Sodium 140  Recent Lipid Panel    Component Value Date/Time   CHOL 159 12/06/2017 0752   TRIG 177 (H) 12/06/2017 0752   HDL 52 12/06/2017 0752   CHOLHDL 3.1 12/06/2017 0752   LDLCALC 72 12/06/2017 0752    Physical Exam:    Physical Exam: Blood pressure 130/60, pulse 61, height 6\' 1"  (1.854 m), weight 179 lb (81.2 kg), SpO2 99 %.  GEN:  Well nourished, well developed in no acute distress HEENT: Normal NECK: No JVD; No carotid bruits LYMPHATICS: No lymphadenopathy CARDIAC: RRR RESPIRATORY:  Clear to auscultation without rales, wheezing or rhonchi  ABDOMEN: Soft, non-tender, non-distended MUSCULOSKELETAL:  No edema; No deformity  SKIN: Warm and dry NEUROLOGIC:  Alert and oriented x 3  ASSESSMENT:    1. Mixed hyperlipidemia   2. Thoracic aortic aneurysm without rupture (HCC)    PLAN:    In order of problems listed above:  1.   Ascending aortid dilitation - very minimal 4.2 cm  His blood pressure and heart rate are well controlled.  I advised him not to do any heavy weightlifting.  He cycles on a regular basis. Chest CT angiogram in 6 months.  I will see him in the office 1 week later.  2.  Hyperlipidemia-LDL is 72 on current dose of Lipitor 40 mg a day.  Triglyceride level is still elevated at  177.  Add fenofibrate 145 mg a day  Check fasting lipids in 3 months.  3. Coronary Calcium -.   Continue  aggressive lipid-lowering.   Medication Adjustments/Labs and Tests Ordered: Current medicines are reviewed at length with the patient today.  Concerns regarding medicines are outlined above.  Orders Placed This Encounter  Procedures  . CT ANGIO CHEST AORTA W &/OR WO CONTRAST  . Lipid Profile  . Basic Metabolic Panel (BMET)  . Hepatic function panel   Meds ordered this encounter  Medications  . fenofibrate (TRICOR) 145 MG tablet    Sig: Take 1 tablet (145 mg  total) by mouth daily.    Dispense:  90 tablet    Refill:  3    Signed, Kristeen Miss, MD  03/13/2018 8:41 AM     [facility]

## 2018-03-13 NOTE — Patient Instructions (Signed)
Medication Instructions:  Your physician has recommended you make the following change in your medication:   START Fenofibrate (Tricor) 145 mg once daily   Labwork: Your physician recommends that you return for lab work in: 3 months  You will need to FAST for this appointment - nothing to eat or drink after midnight the night before except water.   Testing/Procedures: Non-Cardiac CT Angiography (CTA), is a special type of CT scan that uses a computer to produce multi-dimensional views of major blood vessels throughout the body. In CT angiography, a contrast material is injected through an IV to help visualize the blood vessels **in 6 months before your visit with Dr. Elease HashimotoNahser   Follow-Up: Your physician wants you to follow-up in: 6 months with Dr. Elease HashimotoNahser. You will receive a reminder letter in the mail two months in advance. If you don't receive a letter, please call our office to schedule the follow-up appointment.   If you need a refill on your cardiac medications before your next appointment, please call your pharmacy.   Thank you for choosing [facility]! Eligha BridegroomMichelle Swinyer, RN [phone]

## 2018-03-16 ENCOUNTER — Telehealth: Payer: Self-pay | Admitting: Cardiovascular Disease

## 2018-03-16 ENCOUNTER — Telehealth: Payer: Self-pay | Admitting: Nurse Practitioner

## 2018-03-16 NOTE — Telephone Encounter (Signed)
-----   Message from Vesta MixerPhilip J Nahser, MD sent at 03/15/2018  4:23 PM EDT ----- Please order a CT angio of the chest to view the ascending aorta. To follow up with his previous CT scan 9 months ago that found an aortic aneurism of 4.2 cm .  Thanks  Dole FoodPhil

## 2018-03-16 NOTE — Telephone Encounter (Signed)
Patient scheduled for CT on July 25

## 2018-03-16 NOTE — Telephone Encounter (Signed)
New Message        Patient was told to call you to get a  a CT scheduled per Dr. Elease HashimotoNahser, pls call.

## 2018-03-16 NOTE — Telephone Encounter (Signed)
Message forwarded to [place] CT to be rescheduled

## 2018-03-16 NOTE — Telephone Encounter (Signed)
Patient's CT angio has been reschedules for July 25 with patient by our CT department

## 2018-03-19 ENCOUNTER — Other Ambulatory Visit: Payer: BLUE CROSS/BLUE SHIELD | Admitting: *Deleted

## 2018-03-19 DIAGNOSIS — I712 Thoracic aortic aneurysm, without rupture, unspecified: Secondary | ICD-10-CM

## 2018-03-19 DIAGNOSIS — E782 Mixed hyperlipidemia: Secondary | ICD-10-CM

## 2018-03-19 LAB — HEPATIC FUNCTION PANEL
ALBUMIN: 4.7 g/dL (ref 3.5–5.5)
ALK PHOS: 69 IU/L (ref 39–117)
ALT: 24 IU/L (ref 0–44)
AST: 29 IU/L (ref 0–40)
Bilirubin Total: 0.6 mg/dL (ref 0.0–1.2)
Bilirubin, Direct: 0.18 mg/dL (ref 0.00–0.40)
Total Protein: 7 g/dL (ref 6.0–8.5)

## 2018-03-19 LAB — BASIC METABOLIC PANEL
BUN/Creatinine Ratio: 17 (ref 9–20)
BUN: 16 mg/dL (ref 6–24)
CO2: 25 mmol/L (ref 20–29)
CREATININE: 0.92 mg/dL (ref 0.76–1.27)
Calcium: 9.6 mg/dL (ref 8.7–10.2)
Chloride: 100 mmol/L (ref 96–106)
GFR calc Af Amer: 105 mL/min/{1.73_m2} (ref >59)
GFR calc non Af Amer: 91 mL/min/{1.73_m2} (ref >59)
GLUCOSE: 141 mg/dL — AB (ref 65–99)
Potassium: 4.1 mmol/L (ref 3.5–5.2)
Sodium: 142 mmol/L (ref 134–144)

## 2018-03-19 LAB — LIPID PANEL
CHOL/HDL RATIO: 2.6 ratio (ref 0.0–5.0)
Cholesterol, Total: 168 mg/dL (ref 100–199)
HDL: 65 mg/dL (ref >39)
LDL Calculated: 82 mg/dL (ref 0–99)
Triglycerides: 105 mg/dL (ref 0–149)
VLDL CHOLESTEROL CAL: 21 mg/dL (ref 5–40)

## 2018-03-22 ENCOUNTER — Inpatient Hospital Stay: Admission: RE | Admit: 2018-03-22 | Payer: BLUE CROSS/BLUE SHIELD | Source: Ambulatory Visit

## 2018-03-22 ENCOUNTER — Ambulatory Visit (INDEPENDENT_AMBULATORY_CARE_PROVIDER_SITE_OTHER)
Admission: RE | Admit: 2018-03-22 | Discharge: 2018-03-22 | Disposition: A | Discharge status: Home / Self Care | Payer: BLUE CROSS/BLUE SHIELD | Source: Ambulatory Visit | Attending: Cardiovascular Disease | Admitting: Cardiovascular Disease

## 2018-03-22 DIAGNOSIS — I712 Thoracic aortic aneurysm, without rupture, unspecified: Secondary | ICD-10-CM

## 2018-03-22 MED ORDER — IOPAMIDOL (ISOVUE-370) INJECTION 76%
100.0000 mL | Freq: Once | INTRAVENOUS | Status: AC | PRN
  Administered 2018-03-22: 80 mL via INTRAVENOUS

## 2018-03-23 ENCOUNTER — Other Ambulatory Visit: Payer: BLUE CROSS/BLUE SHIELD

## 2018-03-26 ENCOUNTER — Other Ambulatory Visit: Payer: Self-pay | Admitting: Nurse Practitioner

## 2018-03-26 DIAGNOSIS — I712 Thoracic aortic aneurysm, without rupture, unspecified: Secondary | ICD-10-CM

## 2018-03-28 DIAGNOSIS — R7303 Prediabetes: Secondary | ICD-10-CM | POA: Diagnosis not present

## 2018-06-13 ENCOUNTER — Other Ambulatory Visit: Payer: BLUE CROSS/BLUE SHIELD

## 2018-06-15 ENCOUNTER — Other Ambulatory Visit: Payer: BLUE CROSS/BLUE SHIELD | Admitting: *Deleted

## 2018-06-15 ENCOUNTER — Other Ambulatory Visit: Payer: Self-pay

## 2018-06-15 ENCOUNTER — Encounter (INDEPENDENT_AMBULATORY_CARE_PROVIDER_SITE_OTHER): Payer: Self-pay

## 2018-06-15 DIAGNOSIS — E782 Mixed hyperlipidemia: Secondary | ICD-10-CM | POA: Diagnosis not present

## 2018-06-15 LAB — LIPID PANEL
CHOL/HDL RATIO: 2.7 ratio (ref 0.0–5.0)
Cholesterol, Total: 170 mg/dL (ref 100–199)
HDL: 62 mg/dL (ref >39)
LDL Calculated: 89 mg/dL (ref 0–99)
Triglycerides: 93 mg/dL (ref 0–149)
VLDL Cholesterol Cal: 19 mg/dL (ref 5–40)

## 2018-06-15 LAB — HEPATIC FUNCTION PANEL
ALT: 19 IU/L (ref 0–44)
AST: 30 IU/L (ref 0–40)
Albumin: 4.9 g/dL — ABNORMAL HIGH (ref 3.6–4.8)
Alkaline Phosphatase: 55 IU/L (ref 39–117)
BILIRUBIN TOTAL: 0.7 mg/dL (ref 0.0–1.2)
BILIRUBIN, DIRECT: 0.21 mg/dL (ref 0.00–0.40)
TOTAL PROTEIN: 7.3 g/dL (ref 6.0–8.5)

## 2018-08-07 DIAGNOSIS — R7303 Prediabetes: Secondary | ICD-10-CM | POA: Diagnosis not present

## 2018-08-07 DIAGNOSIS — Z125 Encounter for screening for malignant neoplasm of prostate: Secondary | ICD-10-CM | POA: Diagnosis not present

## 2018-08-07 DIAGNOSIS — Z79899 Other long term (current) drug therapy: Secondary | ICD-10-CM | POA: Diagnosis not present

## 2018-08-07 DIAGNOSIS — Z0000 Encounter for general adult medical examination without abnormal findings: Secondary | ICD-10-CM | POA: Diagnosis not present

## 2018-08-07 DIAGNOSIS — Z23 Encounter for immunization: Secondary | ICD-10-CM | POA: Diagnosis not present

## 2018-09-13 ENCOUNTER — Other Ambulatory Visit: Payer: BLUE CROSS/BLUE SHIELD

## 2018-09-14 ENCOUNTER — Other Ambulatory Visit: Payer: Self-pay | Admitting: Cardiovascular Disease

## 2018-11-27 DIAGNOSIS — I2510 Atherosclerotic heart disease of native coronary artery without angina pectoris: Secondary | ICD-10-CM | POA: Diagnosis not present

## 2018-11-27 DIAGNOSIS — I712 Thoracic aortic aneurysm, without rupture: Secondary | ICD-10-CM | POA: Diagnosis not present

## 2018-11-27 DIAGNOSIS — E1169 Type 2 diabetes mellitus with other specified complication: Secondary | ICD-10-CM | POA: Diagnosis not present

## 2018-11-29 DIAGNOSIS — E1169 Type 2 diabetes mellitus with other specified complication: Secondary | ICD-10-CM | POA: Diagnosis not present

## 2018-11-29 DIAGNOSIS — I2510 Atherosclerotic heart disease of native coronary artery without angina pectoris: Secondary | ICD-10-CM | POA: Diagnosis not present

## 2019-03-27 ENCOUNTER — Other Ambulatory Visit: Payer: Self-pay

## 2019-03-27 ENCOUNTER — Other Ambulatory Visit: Payer: Self-pay | Admitting: *Deleted

## 2019-03-27 DIAGNOSIS — Z01812 Encounter for preprocedural laboratory examination: Secondary | ICD-10-CM

## 2019-03-27 DIAGNOSIS — I712 Thoracic aortic aneurysm, without rupture, unspecified: Secondary | ICD-10-CM

## 2019-03-27 LAB — BASIC METABOLIC PANEL
BUN/Creatinine Ratio: 16 (ref 10–24)
BUN: 17 mg/dL (ref 8–27)
CO2: 30 mmol/L — ABNORMAL HIGH (ref 20–29)
Calcium: 9.8 mg/dL (ref 8.6–10.2)
Chloride: 101 mmol/L (ref 96–106)
Creatinine, Ser: 1.09 mg/dL (ref 0.76–1.27)
GFR calc Af Amer: 85 mL/min/{1.73_m2} (ref >59)
GFR calc non Af Amer: 73 mL/min/{1.73_m2} (ref >59)
Glucose: 186 mg/dL — ABNORMAL HIGH (ref 65–99)
Potassium: 4 mmol/L (ref 3.5–5.2)
Sodium: 136 mmol/L (ref 134–144)

## 2019-03-28 ENCOUNTER — Ambulatory Visit (INDEPENDENT_AMBULATORY_CARE_PROVIDER_SITE_OTHER)
Admission: RE | Admit: 2019-03-28 | Discharge: 2019-03-28 | Disposition: A | Discharge status: Home / Self Care | Payer: BC Managed Care – PPO | Source: Ambulatory Visit | Attending: Cardiovascular Disease | Admitting: Cardiovascular Disease

## 2019-03-28 ENCOUNTER — Other Ambulatory Visit: Payer: Self-pay

## 2019-03-28 DIAGNOSIS — I712 Thoracic aortic aneurysm, without rupture, unspecified: Secondary | ICD-10-CM

## 2019-03-28 MED ORDER — IOHEXOL 350 MG/ML SOLN
100.0000 mL | Freq: Once | INTRAVENOUS | Status: AC | PRN
  Administered 2019-03-28: 100 mL via INTRAVENOUS

## 2019-03-29 ENCOUNTER — Telehealth: Payer: Self-pay | Admitting: Nurse Practitioner

## 2019-03-29 NOTE — Telephone Encounter (Signed)
-----   Message from Thayer Headings, MD sent at 03/28/2019  2:59 PM EDT ----- Mild dilitation of the ascending aorta.  No changes from previous study  Will see him at his regularly scheduled appt.  And anticipate repeating the CT angio in a year

## 2019-03-29 NOTE — Telephone Encounter (Signed)
Reviewed CT results with patient who verbalized understanding. He was grateful for the good news.  Patient scheduled for annual follow-up with Dr. Elease HashimotoNahser on August 17. He verbalized understanding and agreement with instructions for virtual appointment.   YOUR CARDIOLOGY TEAM HAS ARRANGED FOR AN E-VISIT FOR YOUR APPOINTMENT - PLEASE REVIEW IMPORTANT INFORMATION BELOW SEVERAL DAYS PRIOR TO YOUR APPOINTMENT  Due to the recent COVID-19 pandemic, we are transitioning in-person office visits to tele-medicine visits in an effort to decrease unnecessary exposure to our patients, their families, and staff. These visits are billed to your insurance just like a normal visit is. We also encourage you to sign up for MyChart if you have not already done so. You will need a smartphone if possible. For patients that do not have this, we can still complete the visit using a regular telephone but do prefer a smartphone to enable video when possible. You may have a family member that lives with you that can help. If possible, we also ask that you have a blood pressure cuff and scale at home to measure your blood pressure, heart rate and weight prior to your scheduled appointment. Patients with clinical needs that need an in-person evaluation and testing will still be able to come to the office if absolutely necessary. If you have any questions, feel free to call our office.   YOUR PROVIDER WILL BE USING THE FOLLOWING PLATFORM TO COMPLETE YOUR VISIT: Doxy.Me   . IF USING DOXIMITY or DOXY.ME - The staff will give you instructions on receiving your link to join the meeting the day of your visit.    2-3 DAYS BEFORE YOUR APPOINTMENT  You will receive a telephone call from one of our [facility] team members - your caller ID may say "Unknown caller." If this is a video visit, we will walk you through how to get the video launched on your phone. We will remind you check your blood pressure, heart rate and weight prior to your  scheduled appointment. If you have an Apple Watch or Kardia, please upload any pertinent ECG strips the day before or morning of your appointment to MyChart. Our staff will also make sure you have reviewed the consent and agree to move forward with your scheduled tele-health visit.    THE DAY OF YOUR APPOINTMENT  Approximately 15 minutes prior to your scheduled appointment, you will receive a telephone call from one of HeartCare team - your caller ID may say "Unknown caller."  Our staff will confirm medications, vital signs for the day and any symptoms you may be experiencing. Please have this information available prior to the time of visit start. It may also be helpful for you to have a pad of paper and pen handy for any instructions given during your visit. They will also walk you through joining the smartphone meeting if this is a video visit.  CONSENT FOR TELE-HEALTH VISIT - PLEASE REVIEW  I hereby voluntarily request, consent and authorize [facility] and its employed or contracted physicians, physician assistants, nurse practitioners or other licensed health care professionals (the Practitioner), to provide me with telemedicine health care services (the "Services") as deemed necessary by the treating Practitioner. I acknowledge and consent to receive the Services by the Practitioner via telemedicine. I understand that the telemedicine visit will involve communicating with the Practitioner through live audiovisual communication technology and the disclosure of certain medical information by electronic transmission. I acknowledge that I have been given the opportunity to request an in-person assessment  or other available alternative prior to the telemedicine visit and am voluntarily participating in the telemedicine visit.  I understand that I have the right to withhold or withdraw my consent to the use of telemedicine in the course of my care at any time, without affecting my right to future care  or treatment, and that the Practitioner or I may terminate the telemedicine visit at any time. I understand that I have the right to inspect all information obtained and/or recorded in the course of the telemedicine visit and may receive copies of available information for a reasonable fee.  I understand that some of the potential risks of receiving the Services via telemedicine include:  Marland Kitchen Delay or interruption in medical evaluation due to technological equipment failure or disruption; . Information transmitted may not be sufficient (e.g. poor resolution of images) to allow for appropriate medical decision making by the Practitioner; and/or  . In rare instances, security protocols could fail, causing a breach of personal health information.  Furthermore, I acknowledge that it is my responsibility to provide information about my medical history, conditions and care that is complete and accurate to the best of my ability. I acknowledge that Practitioner's advice, recommendations, and/or decision may be based on factors not within their control, such as incomplete or inaccurate data provided by me or distortions of diagnostic images or specimens that may result from electronic transmissions. I understand that the practice of medicine is not an exact science and that Practitioner makes no warranties or guarantees regarding treatment outcomes. I acknowledge that I will receive a copy of this consent concurrently upon execution via email to the email address I last provided but may also request a printed copy by calling the office of [place].    I understand that my insurance will be billed for this visit.   I have read or had this consent read to me. . I understand the contents of this consent, which adequately explains the benefits and risks of the Services being provided via telemedicine.  . I have been provided ample opportunity to ask questions regarding this consent and the Services and have had  my questions answered to my satisfaction. . I give my informed consent for the services to be provided through the use of telemedicine in my medical care  By participating in this telemedicine visit I agree to the above.

## 2019-04-11 ENCOUNTER — Telehealth: Payer: Self-pay

## 2019-04-11 NOTE — Telephone Encounter (Signed)
Medications reviewed and appt confirmed for phone visit.

## 2019-04-14 NOTE — Progress Notes (Signed)
Virtual Visit via Video Note   This visit type was conducted due to national recommendations for restrictions regarding the COVID-19 Pandemic (e.g. social distancing) in an effort to limit this patient's exposure and mitigate transmission in our community.  Due to his co-morbid illnesses, this patient is at least at moderate risk for complications without adequate follow up.  This format is felt to be most appropriate for this patient at this time.  All issues noted in this document were discussed and addressed.  A limited physical exam was performed with this format.  Please refer to the patient's chart for his consent to telehealth for Marcus Harvey.   Date:  04/14/2019   ID:  Marcus Harvey, DOB 10/08/1955, MRN [id number]  Patient Location: Home Provider Location: Home  PCP:  Marcus HazelMiller, Lisa, MD  Cardiologist:  Marcus MissPhilip Diamonique Ruedas, MD  Electrophysiologist:  None   Problem list 1.  Left arm pain 2.  Hyperlipidemia 3.  Ascending aortic dilatation 4.  Right bundle branch block  No chief complaint on file.       Marcus Harvey is a 63 y.o. male with a hx of hyperlipidemia.  He originally saw Dr. Mayford Knifeurner for an atypical episode of chest pain.  Coronary CT angiogram revealed a coronary calcium score of 38.  This is in the 56th percentile for age and sex.  He had dilatation of the ascending aorta (44 mm) .  Follow-up CT angiogram showed that the thoracic aortic aneurysm was 4.2 cm.  There was no evidence of dissection.  Fasting labs performed at his medical doctor's office reveals: Total cholesterol = 227 Triglyceride level = 186 HDL = 47 LDL = 142  Has noticed a feeling in his chest  - not a pain ,  Thinks it could be anxiety  Has not had   March 13, 2018: Seen today for follow-up of his hyperlipidemia and mild aortic dilatation, and right bundle branch block. Annette StableBill is done well.  Is not had any symptoms of chest pain or shortness of breath.  He continues to exercise and is very  active.  Echocardiogram performed November, 2018 reveals normal left ventricular systolic function.  EF 60 to 65%.  He has grade 1 diastolic dysfunction. He had a regular treadmill test during which he walked for 12 minutes and 54 seconds.  He had frequent premature ventricular contractions and a 5 beat run of nonsustained VT.  Otherwise the exercise test was negative. Chest CT showed a small 4.2 cm thoracic aortic aneurysm . Recommended repeat in 12 months   Exercising without any issues     Evaluation Performed:  Follow-Up Visit  Chief Complaint:  Hyperlipidemia .  Aug. 17, 2020    Marcus Harvey is a 63 y.o. male with a hx of hyperlipidemia .   He also has mild dilitation of his ascending aorta.   Recent follow up CT angio of his aorta shows no changes from previous CT angio.   Mild ascending aortic enlargement ( 4.2 x 4.2 cm )   Still exercising .  Rides regularly   The patient does not have symptoms concerning for COVID-19 infection (fever, chills, cough, or new shortness of breath).    Past Medical History:  Diagnosis Date  . ADD (attention deficit disorder)   . Hx of hemorrhoids   . Hypercholesterolemia    high TG 1/10, 1/12, 3/13, 3/15, high LDL, TG consider statin ASCVD risk 7-5%, usual risk 3.6%, risk 6%, LDL. 168 11/17, recheck 6 mos.   .Marland Kitchen  Hyperlipidemia 07/12/2017  . Incomplete RBBB 07/12/2017   Past Surgical History:  Procedure Laterality Date  . APPENDECTOMY    . SHOULDER SURGERY Left    multiple dislocations      No outpatient medications have been marked as taking for the 04/15/19 encounter (Appointment) with Jalaysha Skilton, Deloris PingPhilip J, MD.     Allergies:   Patient has no known allergies.   Social History   Tobacco Use  . Smoking status: Never Smoker  . Smokeless tobacco: Never Used  Substance Use Topics  . Alcohol use: Yes  . Drug use: No     Family Hx: The patient's family history includes Cancer (age of onset: 3653) in his brother; Diabetes Mellitus I  in his paternal grandfather.  ROS:   Please see the history of present illness.     All other systems reviewed and are negative.   Prior CV studies:   The following studies were reviewed today:    Labs/Other Tests and Data Reviewed:    EKG:  No ECG reviewed.  Recent Labs: 06/15/2018: ALT 19 03/27/2019: BUN 17; Creatinine, Ser 1.09; Potassium 4.0; Sodium 136   Recent Lipid Panel Lab Results  Component Value Date/Time   CHOL 170 06/15/2018 09:39 AM   TRIG 93 06/15/2018 09:39 AM   HDL 62 06/15/2018 09:39 AM   CHOLHDL 2.7 06/15/2018 09:39 AM   LDLCALC 89 06/15/2018 09:39 AM    Wt Readings from Last 3 Encounters:  03/13/18 179 lb (81.2 kg)  09/04/17 185 lb 12.8 oz (84.3 kg)  07/12/17 179 lb 3.2 oz (81.3 kg)     Objective:    Vital Signs:  There were no vitals taken for this visit.   VITAL SIGNS:  reviewed GEN:  no acute distress EYES:  sclerae anicteric, EOMI - Extraocular Movements Intact RESPIRATORY:  normal respiratory effort, symmetric expansion CARDIOVASCULAR:  no peripheral edema SKIN:  no rash, lesions or ulcers. MUSCULOSKELETAL:  no obvious deformities. NEURO:  alert and oriented x 3, no obvious focal deficit PSYCH:  normal affect  ASSESSMENT & PLAN:    1. Hyperlipidemia: Seems to be doing well.  He is currently on fenofibrate and atorvastatin.  His lipid levels overall look good.  Continue current medications.  2.  Mild ascending aortic dilitation:   His a sending aorta measures 4.2 x 4.2 cm.  It is unchanged from last year.  Will consider getting another CT scan next year.  I will discuss with a vascular surgeon if we need to get these yearly or if we can go 2 or 3 years between scans.  3.  Diabetes mellitus: Marcus Harvey's hemoglobin A1c is 6.8.  His last fasting glucose level was 187.  I encouraged him to get back in touch with his primary medical doctor.  I suggest that he start metformin.  There may be some additional new medications that he could start also.   COVID-19 Education: The signs and symptoms of COVID-19 were discussed with the patient and how to seek care for testing (follow up with PCP or arrange E-visit).  The importance of social distancing was discussed today.  Time:   Today, I have spent  21  minutes with the patient with telehealth technology discussing the above problems.     Medication Adjustments/Labs and Tests Ordered: Current medicines are reviewed at length with the patient today.  Concerns regarding medicines are outlined above.   Tests Ordered: No orders of the defined types were placed in this encounter.   Medication Changes: No  orders of the defined types were placed in this encounter.   Follow Up:  In Person in 1 year(s)  Signed, Mertie Moores, MD  04/14/2019 8:54 PM    [place]

## 2019-04-15 ENCOUNTER — Other Ambulatory Visit: Payer: Self-pay

## 2019-04-15 ENCOUNTER — Telehealth (INDEPENDENT_AMBULATORY_CARE_PROVIDER_SITE_OTHER): Payer: BC Managed Care – PPO | Admitting: Cardiovascular Disease

## 2019-04-15 VITALS — BP 122/77 | HR 68 | Ht 73.0 in | Wt 183.0 lb

## 2019-04-15 DIAGNOSIS — I4510 Unspecified right bundle-branch block: Secondary | ICD-10-CM

## 2019-04-15 DIAGNOSIS — I712 Thoracic aortic aneurysm, without rupture, unspecified: Secondary | ICD-10-CM

## 2019-04-15 DIAGNOSIS — E782 Mixed hyperlipidemia: Secondary | ICD-10-CM

## 2019-04-15 DIAGNOSIS — E7800 Pure hypercholesterolemia, unspecified: Secondary | ICD-10-CM

## 2019-04-15 NOTE — Patient Instructions (Signed)
Medication Instructions:  Your physician recommends that you continue on your current medications as directed. Please refer to the Current Medication list given to you today.  If you need a refill on your cardiac medications before your next appointment, please call your pharmacy.    Lab work: Your physician recommends that you return for lab work in: 12 months on the day of or a few days before your office visit with Dr. Nahser.  You will need to FAST for this appointment - nothing to eat or drink after midnight the night before except water.    Testing/Procedures: None Ordered   Follow-Up: At [facility], you and your health needs are our priority.  As part of our continuing mission to provide you with exceptional heart care, we have created designated Provider Care Teams.  These Care Teams include your primary Cardiologist (physician) and Advanced Practice Providers (APPs -  Physician Assistants and Nurse Practitioners) who all work together to provide you with the care you need, when you need it. You will need a follow up appointment in:  1 years.  Please call our office 2 months in advance to schedule this appointment.  You may see Philip Nahser, MD or one of the following Advanced Practice Providers on your designated Care Team: Scott Weaver, PA-C Vin Bhagat, PA-C . Janine Hammond, NP    

## 2019-04-24 ENCOUNTER — Other Ambulatory Visit: Payer: Self-pay | Admitting: Cardiovascular Disease

## 2019-07-04 DIAGNOSIS — E119 Type 2 diabetes mellitus without complications: Secondary | ICD-10-CM | POA: Diagnosis not present

## 2019-07-04 DIAGNOSIS — H43393 Other vitreous opacities, bilateral: Secondary | ICD-10-CM | POA: Diagnosis not present

## 2019-12-17 ENCOUNTER — Ambulatory Visit: Payer: BC Managed Care – PPO | Attending: Internal Medicine

## 2019-12-17 DIAGNOSIS — Z23 Encounter for immunization: Secondary | ICD-10-CM

## 2019-12-17 NOTE — Progress Notes (Signed)
   Covid-19 Vaccination Clinic  Name:  Caelin Rayl    MRN: [id number] DOB: 23-Aug-1956  12/17/2019  Mr. Mckibben was observed post Covid-19 immunization for 15 minutes without incident. He was provided with Vaccine Information Sheet and instruction to access the V-Safe system.   Mr. Harty was instructed to call 911 with any severe reactions post vaccine: Marland Kitchen Difficulty breathing  . Swelling of face and throat  . A fast heartbeat  . A bad rash all over body  . Dizziness and weakness   Immunizations Administered    Name Date Dose VIS Date Route   Pfizer COVID-19 Vaccine 12/17/2019 11:26 AM 0.3 mL 10/23/2018 Intramuscular   Manufacturer: [employer], [employer]   Lot: [id number]   NDC: [address]-8

## 2020-01-08 ENCOUNTER — Ambulatory Visit: Payer: Self-pay | Attending: Internal Medicine

## 2020-01-08 DIAGNOSIS — Z23 Encounter for immunization: Secondary | ICD-10-CM

## 2020-01-08 NOTE — Progress Notes (Signed)
   Covid-19 Vaccination Clinic  Name:  Marcus Harvey    MRN: [id number] DOB: 05/04/1956  01/08/2020  [name] was observed post Covid-19 immunization for 15 minutes without incident. He was provided with Vaccine Information Sheet and instruction to access the V-Safe system.   [name] was instructed to call 911 with any severe reactions post vaccine: Marland Kitchen Difficulty breathing  . Swelling of face and throat  . A fast heartbeat  . A bad rash all over body  . Dizziness and weakness   Immunizations Administered    Name Date Dose VIS Date Route   Pfizer COVID-19 Vaccine 01/08/2020  8:16 AM 0.3 mL 10/23/2018 Intramuscular   Manufacturer: [employer], [employer]   Lot: [id number]   NDC: [address]-7

## 2020-03-03 DIAGNOSIS — I2510 Atherosclerotic heart disease of native coronary artery without angina pectoris: Secondary | ICD-10-CM | POA: Diagnosis not present

## 2020-03-03 DIAGNOSIS — E1169 Type 2 diabetes mellitus with other specified complication: Secondary | ICD-10-CM | POA: Diagnosis not present

## 2020-03-03 DIAGNOSIS — Z125 Encounter for screening for malignant neoplasm of prostate: Secondary | ICD-10-CM | POA: Diagnosis not present

## 2020-03-03 DIAGNOSIS — R7303 Prediabetes: Secondary | ICD-10-CM | POA: Diagnosis not present

## 2020-03-03 DIAGNOSIS — I712 Thoracic aortic aneurysm, without rupture: Secondary | ICD-10-CM | POA: Diagnosis not present

## 2020-03-03 DIAGNOSIS — Z0000 Encounter for general adult medical examination without abnormal findings: Secondary | ICD-10-CM | POA: Diagnosis not present

## 2020-04-17 DIAGNOSIS — I2510 Atherosclerotic heart disease of native coronary artery without angina pectoris: Secondary | ICD-10-CM | POA: Diagnosis not present

## 2020-04-17 DIAGNOSIS — M79602 Pain in left arm: Secondary | ICD-10-CM | POA: Diagnosis not present

## 2020-04-17 DIAGNOSIS — F419 Anxiety disorder, unspecified: Secondary | ICD-10-CM | POA: Diagnosis not present

## 2020-04-19 NOTE — Progress Notes (Signed)
Cardiology Office Note:    Date:  62/23/2021   ID:  Marcus Harvey, DOB [birth year], MRN [id number]  PCP:  Sigmund Hazel, MD  Cardiologist:  Kristeen Miss, MD    Referring MD: Sigmund Hazel, MD   Problem list 1.  Left arm pain 2.  Hyperlipidemia 3.  Ascending aortic dilatation 4.  Right bundle branch block  Chief Complaint  Patient presents with  . Hyperlipidemia    History of Present Illness:    Marcus Harvey is a 64 y.o. male with a hx of hyperlipidemia.  He originally saw Dr. Mayford Knife for an atypical episode of chest pain.  Coronary CT angiogram revealed a coronary calcium score of 38.  This is in the 56th percentile for age and sex.  He had dilatation of the ascending aorta (44 mm) .  Follow-up CT angiogram showed that the thoracic aortic aneurysm was 4.2 cm.  There was no evidence of dissection.  Fasting labs performed at his medical doctor's office reveals: Total cholesterol = 227 Triglyceride level = 186 HDL = 47 LDL = 142  Has noticed a feeling in his chest  - not a pain ,  Thinks it could be anxiety  Has not had   March 13, 2018: Seen today for follow-up of his hyperlipidemia and mild aortic dilatation, and right bundle branch block. [name] is done well.  Is not had any symptoms of chest pain or shortness of breath.  He continues to exercise and is very active.  Echocardiogram performed November, 2018 reveals normal left ventricular systolic function.  EF 60 to 65%.  He has grade 1 diastolic dysfunction. He had a regular treadmill test during which he walked for 12 minutes and 54 seconds.  He had frequent premature ventricular contractions and a 5 beat run of nonsustained VT.  Otherwise the exercise test was negative. Chest CT showed a small 4.2 cm thoracic aortic aneurysm . Recommended repeat in 12 months   Exercising without any issues    Aug. 23, 2021  [name]  is seen today for follow up of his coronary art calcification, hyperlipidemia, mild aortic dilitation He  has tubular ascending thoracic aorta measuring 4.2 x 4.2 cm.  This mild dilatation is unchanged compared to prior examinations dating back to December, 2018. Coronary calcium score of 38. This was 60 percentile for age and sex matched control.  No CP , no dyspnea.   Still biking regularly .  Was started on Lexipro recently .   Labs reviewed - trigs are elevated.  He stopped his fenofibrate ( ran out, didn't refill )  Does not eat as well as he should    Past Medical History:  Diagnosis Date  . ADD (attention deficit disorder)   . Hx of hemorrhoids   . Hypercholesterolemia    high TG 1/10, 1/12, 3/13, 3/15, high LDL, TG consider statin ASCVD risk 7-5%, usual risk 3.6%, risk 6%, LDL. 168 11/17, recheck 6 mos.   . Hyperlipidemia 07/12/2017  . Incomplete RBBB 07/12/2017    Past Surgical History:  Procedure Laterality Date  . APPENDECTOMY    . SHOULDER SURGERY Left    multiple dislocations     Current Medications: Current Meds  Medication Sig  . atorvastatin (LIPITOR) 40 MG tablet TAKE 1 TABLET BY MOUTH EVERY DAY  . escitalopram (LEXAPRO) 10 MG tablet Take 10 mg by mouth daily.  . fenofibrate (TRICOR) 145 MG tablet TAKE 1 TABLET BY MOUTH EVERY DAY     Allergies:   Patient has  no known allergies.   Social History   Socioeconomic History  . Marital status: Married    Spouse name: Not on file  . Number of children: 2  . Years of education: college   . Highest education level: Not on file  Occupational History  . Occupation: Marketing executive  Tobacco Use  . Smoking status: Never Smoker  . Smokeless tobacco: Never Used  Vaping Use  . Vaping Use: Never used  Substance and Sexual Activity  . Alcohol use: Yes  . Drug use: No  . Sexual activity: Not on file  Other Topics Concern  . Not on file  Social History Narrative  . Not on file   Social Determinants of Health   Financial Resource Strain:   . Difficulty of Paying Living Expenses: Not on file   Food Insecurity:   . Worried About Programme researcher, broadcasting/film/video in the Last Year: Not on file  . Ran Out of Food in the Last Year: Not on file  Transportation Needs:   . Lack of Transportation (Medical): Not on file  . Lack of Transportation (Non-Medical): Not on file  Physical Activity:   . Days of Exercise per Week: Not on file  . Minutes of Exercise per Session: Not on file  Stress:   . Feeling of Stress : Not on file  Social Connections:   . Frequency of Communication with Friends and Family: Not on file  . Frequency of Social Gatherings with Friends and Family: Not on file  . Attends Religious Services: Not on file  . Active Member of Clubs or Organizations: Not on file  . Attends Banker Meetings: Not on file  . Marital Status: Not on file     Family History: The patient's family history includes Cancer (age of onset: 80) in his brother; Diabetes Mellitus I in his paternal grandfather. ROS:   Please see the history of present illness.     All other systems reviewed and are negative.  EKGs/Labs/Other Studies Reviewed:       EKG:    Aug. 23, 2021:  Sinus brady.  No ST or T wave abn.   Recent Labs: No results found for requested labs within last 8760 hours.  Recent Lipid Panel    Component Value Date/Time   CHOL 170 06/15/2018 0939   TRIG 93 06/15/2018 0939   HDL 62 06/15/2018 0939   CHOLHDL 2.7 06/15/2018 0939   LDLCALC 89 06/15/2018 0939    Physical Exam:    Physical Exam: Blood pressure 130/76, pulse (!) 53, height 6\' 1"  (1.854 m), weight 181 lb 3.2 oz (82.2 kg), SpO2 99 %.  GEN:  Well nourished, well developed in no acute distress HEENT: Normal NECK: No JVD; No carotid bruits LYMPHATICS: No lymphadenopathy CARDIAC: RRR , no murmurs, rubs, gallops RESPIRATORY:  Clear to auscultation without rales, wheezing or rhonchi  ABDOMEN: Soft, non-tender, non-distended MUSCULOSKELETAL:  No edema; No deformity  SKIN: Warm and dry NEUROLOGIC:  Alert and  oriented x 3   ASSESSMENT:    1. Thoracic aortic aneurysm without rupture (HCC)   2. Mixed hyperlipidemia    PLAN:    In order of problems listed above:  1.   Ascending aortid dilitation - very minimal 4.2 cm .   Has been unchanged for 3 years.   Will repeat CT angio of asc. Aorta in a year  Advised him to not lift anything heavy.    2.  Hyperlipidemia:  Admits to eating  a very poor diet.  Lots of processed foods late at night  Restart fenofibrate.   Check labs in a year.   3. Coronary Calcium -.   Cont atorvastatin 40 mg  Continue current medications.   Medication Adjustments/Labs and Tests Ordered: Current medicines are reviewed at length with the patient today.  Concerns regarding medicines are outlined above.  Orders Placed This Encounter  Procedures  . CT ANGIO CHEST AORTA W/CM & OR WO/CM  . Lipid panel  . Hepatic function panel  . Basic metabolic panel  . EKG 12-Lead   No orders of the defined types were placed in this encounter.    Signed, Kristeen Miss, MD  62/23/2021 5:37 PM    [facility]

## 2020-04-20 ENCOUNTER — Ambulatory Visit (INDEPENDENT_AMBULATORY_CARE_PROVIDER_SITE_OTHER): Payer: BC Managed Care – PPO | Admitting: Cardiovascular Disease

## 2020-04-20 ENCOUNTER — Other Ambulatory Visit: Payer: Self-pay

## 2020-04-20 ENCOUNTER — Encounter: Payer: Self-pay | Admitting: Cardiovascular Disease

## 2020-04-20 VITALS — BP 130/76 | HR 53 | Ht 73.0 in | Wt 181.2 lb

## 2020-04-20 DIAGNOSIS — I712 Thoracic aortic aneurysm, without rupture, unspecified: Secondary | ICD-10-CM

## 2020-04-20 DIAGNOSIS — E782 Mixed hyperlipidemia: Secondary | ICD-10-CM

## 2020-04-20 NOTE — Patient Instructions (Signed)
Medication Instructions:  Your physician recommends that you continue on your current medications as directed. Please refer to the Current Medication list given to you today.  *If you need a refill on your cardiac medications before your next appointment, please call your pharmacy*   Lab Work: BMET, Lipid and Liver 1-2 weeks prior to follow up.  You will need to be fasting for these labs (nothing to eat or drink after midnight except water and black coffee).   If you have labs (blood work) drawn today and your tests are completely normal, you will receive your results only by: Marland Kitchen MyChart Message (if you have MyChart) OR . A paper copy in the mail If you have any lab test that is abnormal or we need to change your treatment, we will call you to review the results.   Testing/Procedures: Your physician recommends that you have a CT Angio Aorta 1-2 weeks prior to follow up.   Follow-Up: At [facility], you and your health needs are our priority.  As part of our continuing mission to provide you with exceptional heart care, we have created designated Provider Care Teams.  These Care Teams include your primary Cardiologist (physician) and Advanced Practice Providers (APPs -  Physician Assistants and Nurse Practitioners) who all work together to provide you with the care you need, when you need it.  We recommend signing up for the patient portal called "MyChart".  Sign up information is provided on this After Visit Summary.  MyChart is used to connect with patients for Virtual Visits (Telemedicine).  Patients are able to view lab/test results, encounter notes, upcoming appointments, etc.  Non-urgent messages can be sent to your provider as well.   To learn more about what you can do with MyChart, go to [email or web address].    Your next appointment:   12 month(s)  The format for your next appointment:   In Person  Provider:   You may see Kristeen Miss, MD or one of the following  Advanced Practice Providers on your designated Care Team:    Tereso Newcomer, PA-C  Chelsea Aus, [place]    Other Instructions

## 2020-05-17 ENCOUNTER — Other Ambulatory Visit: Payer: Self-pay | Admitting: Cardiovascular Disease

## 2020-08-04 DIAGNOSIS — H1045 Other chronic allergic conjunctivitis: Secondary | ICD-10-CM | POA: Diagnosis not present

## 2020-08-04 DIAGNOSIS — H10413 Chronic giant papillary conjunctivitis, bilateral: Secondary | ICD-10-CM | POA: Diagnosis not present

## 2020-08-19 ENCOUNTER — Telehealth: Payer: Self-pay | Admitting: Cardiovascular Disease

## 2020-08-19 NOTE — Telephone Encounter (Signed)
Patient states he just did a home test and tested positive for covid and would like to know if he should get the monoclonal antibody infusion.

## 2020-08-19 NOTE — Telephone Encounter (Signed)
Pt advised to follow up with PCP in reference to this. Pt reports no symptoms besides a "scratchy throat". Patient verbalized understanding and agreeable to plan.

## 2020-11-18 DIAGNOSIS — Z01812 Encounter for preprocedural laboratory examination: Secondary | ICD-10-CM | POA: Diagnosis not present

## 2021-01-27 DIAGNOSIS — D229 Melanocytic nevi, unspecified: Secondary | ICD-10-CM | POA: Diagnosis not present

## 2021-01-27 DIAGNOSIS — A630 Anogenital (venereal) warts: Secondary | ICD-10-CM | POA: Diagnosis not present

## 2021-01-27 DIAGNOSIS — L821 Other seborrheic keratosis: Secondary | ICD-10-CM | POA: Diagnosis not present

## 2021-01-27 DIAGNOSIS — L570 Actinic keratosis: Secondary | ICD-10-CM | POA: Diagnosis not present

## 2021-03-18 ENCOUNTER — Telehealth: Payer: Self-pay

## 2021-03-18 ENCOUNTER — Other Ambulatory Visit: Payer: BC Managed Care – PPO

## 2021-03-18 ENCOUNTER — Other Ambulatory Visit: Payer: Self-pay

## 2021-03-18 DIAGNOSIS — I712 Thoracic aortic aneurysm, without rupture, unspecified: Secondary | ICD-10-CM

## 2021-03-18 DIAGNOSIS — E782 Mixed hyperlipidemia: Secondary | ICD-10-CM

## 2021-03-18 LAB — BASIC METABOLIC PANEL
BUN/Creatinine Ratio: 15 (ref 10–24)
BUN: 16 mg/dL (ref 8–27)
CO2: 25 mmol/L (ref 20–29)
Calcium: 8.8 mg/dL (ref 8.6–10.2)
Chloride: 99 mmol/L (ref 96–106)
Creatinine, Ser: 1.06 mg/dL (ref 0.76–1.27)
Glucose: 334 mg/dL — ABNORMAL HIGH (ref 65–99)
Potassium: 3.9 mmol/L (ref 3.5–5.2)
Sodium: 137 mmol/L (ref 134–144)
eGFR: 79 mL/min/{1.73_m2} (ref >59)

## 2021-03-18 NOTE — Telephone Encounter (Signed)
Pt advised and he will be back in this afternoon for a repeat lab draw.

## 2021-03-18 NOTE — Telephone Encounter (Signed)
Left a message for the pt to call back:   Received call from Katrina in the lab and his blood after difficult draw and spinning it down appeared hemolyzed. The pt needs to have it redrawn.   No need to place a new order if he comes in today for the BMET but will need to replace the order if he cannot come until another day.

## 2021-03-19 ENCOUNTER — Other Ambulatory Visit: Payer: Self-pay | Admitting: *Deleted

## 2021-03-19 MED ORDER — METFORMIN HCL ER (OSM) 500 MG PO TB24: 500.0000 mg | ORAL_TABLET | Freq: Two times a day (BID) | ORAL | 3 refills | Status: DC

## 2021-03-22 ENCOUNTER — Other Ambulatory Visit: Payer: Self-pay

## 2021-03-22 DIAGNOSIS — E1169 Type 2 diabetes mellitus with other specified complication: Secondary | ICD-10-CM | POA: Diagnosis not present

## 2021-03-22 DIAGNOSIS — I712 Thoracic aortic aneurysm, without rupture: Secondary | ICD-10-CM | POA: Diagnosis not present

## 2021-03-22 DIAGNOSIS — E7800 Pure hypercholesterolemia, unspecified: Secondary | ICD-10-CM | POA: Diagnosis not present

## 2021-03-22 MED ORDER — METFORMIN HCL ER 500 MG PO TB24: 500.0000 mg | ORAL_TABLET | Freq: Two times a day (BID) | ORAL | 3 refills | Status: DC

## 2021-03-23 ENCOUNTER — Ambulatory Visit (INDEPENDENT_AMBULATORY_CARE_PROVIDER_SITE_OTHER)
Admission: RE | Admit: 2021-03-23 | Discharge: 2021-03-23 | Disposition: A | Discharge status: Home / Self Care | Payer: BC Managed Care – PPO | Source: Ambulatory Visit | Attending: Cardiovascular Disease | Admitting: Cardiovascular Disease

## 2021-03-23 ENCOUNTER — Other Ambulatory Visit: Payer: Self-pay

## 2021-03-23 DIAGNOSIS — I2510 Atherosclerotic heart disease of native coronary artery without angina pectoris: Secondary | ICD-10-CM | POA: Diagnosis not present

## 2021-03-23 DIAGNOSIS — I712 Thoracic aortic aneurysm, without rupture, unspecified: Secondary | ICD-10-CM

## 2021-03-23 MED ORDER — IOHEXOL 350 MG/ML SOLN
100.0000 mL | Freq: Once | INTRAVENOUS | Status: AC | PRN
  Administered 2021-03-23: 100 mL via INTRAVENOUS

## 2021-04-28 DIAGNOSIS — E1169 Type 2 diabetes mellitus with other specified complication: Secondary | ICD-10-CM | POA: Diagnosis not present

## 2021-04-28 DIAGNOSIS — Z0000 Encounter for general adult medical examination without abnormal findings: Secondary | ICD-10-CM | POA: Diagnosis not present

## 2021-04-28 DIAGNOSIS — Z23 Encounter for immunization: Secondary | ICD-10-CM | POA: Diagnosis not present

## 2021-04-28 DIAGNOSIS — E7800 Pure hypercholesterolemia, unspecified: Secondary | ICD-10-CM | POA: Diagnosis not present

## 2021-06-02 ENCOUNTER — Other Ambulatory Visit: Payer: Self-pay | Admitting: Cardiovascular Disease

## 2021-06-22 DIAGNOSIS — E1169 Type 2 diabetes mellitus with other specified complication: Secondary | ICD-10-CM | POA: Diagnosis not present

## 2021-06-22 DIAGNOSIS — E781 Pure hyperglyceridemia: Secondary | ICD-10-CM | POA: Diagnosis not present

## 2021-08-27 DIAGNOSIS — M79671 Pain in right foot: Secondary | ICD-10-CM | POA: Diagnosis not present

## 2021-08-27 DIAGNOSIS — E119 Type 2 diabetes mellitus without complications: Secondary | ICD-10-CM | POA: Diagnosis not present

## 2021-08-27 DIAGNOSIS — I7300 Raynaud's syndrome without gangrene: Secondary | ICD-10-CM | POA: Diagnosis not present

## 2021-08-27 DIAGNOSIS — L602 Onychogryphosis: Secondary | ICD-10-CM | POA: Diagnosis not present

## 2021-08-27 DIAGNOSIS — M79672 Pain in left foot: Secondary | ICD-10-CM | POA: Diagnosis not present

## 2021-09-17 DIAGNOSIS — K649 Unspecified hemorrhoids: Secondary | ICD-10-CM | POA: Diagnosis not present

## 2021-09-17 DIAGNOSIS — Z1211 Encounter for screening for malignant neoplasm of colon: Secondary | ICD-10-CM | POA: Diagnosis not present

## 2021-09-17 DIAGNOSIS — K621 Rectal polyp: Secondary | ICD-10-CM | POA: Diagnosis not present

## 2021-09-28 DIAGNOSIS — Z7984 Long term (current) use of oral hypoglycemic drugs: Secondary | ICD-10-CM | POA: Diagnosis not present

## 2021-09-28 DIAGNOSIS — E119 Type 2 diabetes mellitus without complications: Secondary | ICD-10-CM | POA: Diagnosis not present

## 2021-12-21 DIAGNOSIS — E1169 Type 2 diabetes mellitus with other specified complication: Secondary | ICD-10-CM | POA: Diagnosis not present

## 2021-12-21 DIAGNOSIS — E781 Pure hyperglyceridemia: Secondary | ICD-10-CM | POA: Diagnosis not present

## 2021-12-21 DIAGNOSIS — Z79899 Other long term (current) drug therapy: Secondary | ICD-10-CM | POA: Diagnosis not present

## 2021-12-27 DIAGNOSIS — E1169 Type 2 diabetes mellitus with other specified complication: Secondary | ICD-10-CM | POA: Diagnosis not present

## 2021-12-27 DIAGNOSIS — E7800 Pure hypercholesterolemia, unspecified: Secondary | ICD-10-CM | POA: Diagnosis not present

## 2021-12-30 DIAGNOSIS — E1169 Type 2 diabetes mellitus with other specified complication: Secondary | ICD-10-CM | POA: Diagnosis not present

## 2021-12-30 DIAGNOSIS — E781 Pure hyperglyceridemia: Secondary | ICD-10-CM | POA: Diagnosis not present

## 2022-02-21 ENCOUNTER — Encounter: Payer: Self-pay | Admitting: Cardiovascular Disease

## 2022-02-22 ENCOUNTER — Encounter: Payer: Self-pay | Admitting: Cardiovascular Disease

## 2022-02-22 ENCOUNTER — Ambulatory Visit: Payer: BC Managed Care – PPO | Admitting: Cardiovascular Disease

## 2022-02-22 VITALS — BP 142/80 | HR 58 | Ht 73.0 in | Wt 183.6 lb

## 2022-02-22 DIAGNOSIS — I7121 Aneurysm of the ascending aorta, without rupture: Secondary | ICD-10-CM | POA: Diagnosis not present

## 2022-02-22 MED ORDER — DAPAGLIFLOZIN PROPANEDIOL 10 MG PO TABS: 10.0000 mg | ORAL_TABLET | Freq: Every day | ORAL | 3 refills | Status: DC

## 2022-02-28 ENCOUNTER — Ambulatory Visit (HOSPITAL_BASED_OUTPATIENT_CLINIC_OR_DEPARTMENT_OTHER): Payer: BC Managed Care – PPO

## 2022-03-08 ENCOUNTER — Encounter (HOSPITAL_BASED_OUTPATIENT_CLINIC_OR_DEPARTMENT_OTHER): Payer: Self-pay

## 2022-03-08 ENCOUNTER — Ambulatory Visit (HOSPITAL_BASED_OUTPATIENT_CLINIC_OR_DEPARTMENT_OTHER)
Admission: RE | Admit: 2022-03-08 | Discharge: 2022-03-08 | Disposition: A | Discharge status: Home / Self Care | Payer: BC Managed Care – PPO | Source: Ambulatory Visit | Attending: Cardiovascular Disease | Admitting: Cardiovascular Disease

## 2022-03-08 DIAGNOSIS — I7121 Aneurysm of the ascending aorta, without rupture: Secondary | ICD-10-CM | POA: Diagnosis not present

## 2022-03-08 LAB — POCT I-STAT CREATININE: Creatinine, Ser: 0.9 mg/dL (ref 0.61–1.24)

## 2022-03-08 MED ORDER — IOHEXOL 350 MG/ML SOLN
100.0000 mL | Freq: Once | INTRAVENOUS | Status: AC | PRN
  Administered 2022-03-08: 75 mL via INTRAVENOUS

## 2022-03-10 ENCOUNTER — Telehealth: Payer: Self-pay

## 2022-03-10 DIAGNOSIS — I7121 Aneurysm of the ascending aorta, without rupture: Secondary | ICD-10-CM

## 2022-03-10 NOTE — Telephone Encounter (Signed)
-----   Message from Vesta Mixer, MD sent at 03/08/2022 11:29 AM EDT ----- No change in the diameter of his aorta . Mild dilatation of the ascending aorta.  Repeat CT angio of the aorta in 1 year

## 2022-03-10 NOTE — Telephone Encounter (Signed)
Left detailed message of results per DPR. Repeat study in one year-order placed at this time.

## 2022-09-28 DIAGNOSIS — E119 Type 2 diabetes mellitus without complications: Secondary | ICD-10-CM | POA: Diagnosis not present

## 2022-09-28 DIAGNOSIS — H40012 Open angle with borderline findings, low risk, left eye: Secondary | ICD-10-CM | POA: Diagnosis not present

## 2022-09-28 LAB — HM DIABETES EYE EXAM

## 2022-10-05 DIAGNOSIS — E1169 Type 2 diabetes mellitus with other specified complication: Secondary | ICD-10-CM | POA: Diagnosis not present

## 2022-10-05 DIAGNOSIS — F411 Generalized anxiety disorder: Secondary | ICD-10-CM | POA: Diagnosis not present

## 2022-10-05 DIAGNOSIS — Z23 Encounter for immunization: Secondary | ICD-10-CM | POA: Diagnosis not present

## 2022-10-05 DIAGNOSIS — I10 Essential (primary) hypertension: Secondary | ICD-10-CM | POA: Diagnosis not present

## 2022-10-05 DIAGNOSIS — E7800 Pure hypercholesterolemia, unspecified: Secondary | ICD-10-CM | POA: Diagnosis not present

## 2023-04-05 DIAGNOSIS — E1169 Type 2 diabetes mellitus with other specified complication: Secondary | ICD-10-CM | POA: Diagnosis not present

## 2023-04-05 DIAGNOSIS — F411 Generalized anxiety disorder: Secondary | ICD-10-CM | POA: Diagnosis not present

## 2023-04-05 DIAGNOSIS — E7800 Pure hypercholesterolemia, unspecified: Secondary | ICD-10-CM | POA: Diagnosis not present

## 2023-04-05 DIAGNOSIS — I2510 Atherosclerotic heart disease of native coronary artery without angina pectoris: Secondary | ICD-10-CM | POA: Diagnosis not present

## 2023-04-05 LAB — LAB REPORT - SCANNED
A1c: 7.1
Creatinine, POC: 87 mg/dL
EGFR: 91
Microalb Creat Ratio: 26.4
Microalbumin, Urine: 2.3

## 2023-04-06 ENCOUNTER — Encounter: Payer: Self-pay | Admitting: Cardiovascular Disease

## 2023-04-09 ENCOUNTER — Encounter: Payer: Self-pay | Admitting: Cardiovascular Disease

## 2023-04-09 NOTE — Progress Notes (Unsigned)
Cardiology Office Note:    Date:  04/10/2023   ID:  Dsean Dorame, DOB 06-08-1956, MRN [id number]  PCP:  Sigmund Hazel, MD  Cardiologist:  Kristeen Miss, MD    Referring MD: Sigmund Hazel, MD   Problem list 1.  Left arm pain 2.  Hyperlipidemia 3.  Ascending aortic dilatation 4.  Right bundle branch block  Chief Complaint  Patient presents with   thoracic aortic aneurism    Hyperlipidemia    History of Present Illness:    Joaopedro Julich is a 67 y.o. male with a hx of hyperlipidemia.  He originally saw Dr. Mayford Knife for an atypical episode of chest pain.  Coronary CT angiogram revealed a coronary calcium score of 38.  This is in the 56th percentile for age and sex.  He had dilatation of the ascending aorta (44 mm) .  Follow-up CT angiogram showed that the thoracic aortic aneurysm was 4.2 cm.  There was no evidence of dissection.  Fasting labs performed at his medical doctor's office reveals: Total cholesterol = 227 Triglyceride level = 186 HDL = 47 LDL = 142  Has noticed a feeling in his chest  - not a pain ,  Thinks it could be anxiety  Has not had   March 13, 2018: Seen today for follow-up of his hyperlipidemia and mild aortic dilatation, and right bundle branch block. [name] is done well.  Is not had any symptoms of chest pain or shortness of breath.  He continues to exercise and is very active.  Echocardiogram performed November, 2018 reveals normal left ventricular systolic function.  EF 60 to 65%.  He has grade 1 diastolic dysfunction. He had a regular treadmill test during which he walked for 12 minutes and 54 seconds.  He had frequent premature ventricular contractions and a 5 beat run of nonsustained VT.  Otherwise the exercise test was negative. Chest CT showed a small 4.2 cm thoracic aortic aneurysm . Recommended repeat in 12 months   Exercising without any issues    Aug. 23, 2021  [name]  is seen today for follow up of his coronary art calcification,  hyperlipidemia, mild aortic dilitation He has tubular ascending thoracic aorta measuring 4.2 x 4.2 cm.  This mild dilatation is unchanged compared to prior examinations dating back to December, 2018. Coronary calcium score of 38. This was 51 percentile for age and sex matched control.  No CP , no dyspnea.   Still biking regularly .  Was started on Lexipro recently .   Labs reviewed - trigs are elevated.  He stopped his fenofibrate ( ran out, didn't refill )  Does not eat as well as he should   February 22, 2022 [name] is seen today for follow up of his coronary artery calcificaiton , mild aortic dilatation , HLD New diagnosis of diabetes , we started him on metformin several years ago  Last measurement of his ascending aorta 4.2 -4.3  Cycling 60-70 miles a week . Work is steady ,  currently rebuilding his kitchen    Aug. 12, 2024 [name] is seen for follow up of his CAD, mild aortic dilatation HLD  Still cycling  CT angiogram of his aorta July, 22, 2023 reveals mild dilatation of the ascending aorta measuring 4.2 to 4.3 cm.  This is essentially unchanged from his previous CT angiogram in July, 2022.  Was seen by his primary MD last week  His BP was elevated   HbA1c, trigs, chol were all elevated  Hb A1c was  7.1 total cholesterol is 269 triglyceride level is 309  his LDL is 168 Glucose 204   Still on Atorvastatin 40 mg a day  Has stopped his metformin  - lots of GI side effects       He stopped drinking completely last Jan. Over the summer, he has gradually let his diet go a bit   Past Medical History:  Diagnosis Date   ADD (attention deficit disorder)    Hx of hemorrhoids    Hypercholesterolemia    high TG 1/10, 1/12, 3/13, 3/15, high LDL, TG consider statin ASCVD risk 7-5%, usual risk 3.6%, risk 6%, LDL. 168 11/17, recheck 6 mos.    Hyperlipidemia 07/12/2017   Incomplete RBBB 07/12/2017    Past Surgical History:  Procedure Laterality Date   APPENDECTOMY     SHOULDER  SURGERY Left    multiple dislocations     Current Medications: Current Meds  Medication Sig   atorvastatin (LIPITOR) 40 MG tablet Take 1 tablet (40 mg total) by mouth daily. Please make overdue appt with Dr. Elease Hashimoto before anymore refills. Thank you 1st attempt   fenofibrate (TRICOR) 145 MG tablet TAKE 1 TABLET BY MOUTH EVERY DAY   losartan (COZAAR) 25 MG tablet Take 25 mg by mouth daily.   metFORMIN (GLUCOPHAGE-XR) 500 MG 24 hr tablet Take 1 tablet (500 mg total) by mouth 2 (two) times daily with a meal.   [DISCONTINUED] dapagliflozin propanediol (FARXIGA) 10 MG TABS tablet Take 1 tablet (10 mg total) by mouth daily before breakfast.     Allergies:   Patient has no known allergies.   Social History   Socioeconomic History   Marital status: Married    Spouse name: Not on file   Number of children: 2   Years of education: college    Highest education level: Not on file  Occupational History   Occupation: Marketing executive  Tobacco Use   Smoking status: Never   Smokeless tobacco: Never  Vaping Use   Vaping status: Never Used  Substance and Sexual Activity   Alcohol use: Yes   Drug use: No   Sexual activity: Not on file  Other Topics Concern   Not on file  Social History Narrative   Not on file   Social Determinants of Health   Financial Resource Strain: Not on file  Food Insecurity: Not on file  Transportation Needs: Not on file  Physical Activity: Not on file  Stress: Not on file  Social Connections: Not on file     Family History: The patient's family history includes Cancer (age of onset: 58) in his brother; Diabetes Mellitus I in his paternal grandfather. ROS:   Please see the history of present illness.     All other systems reviewed and are negative.  EKGs/Labs/Other Studies Reviewed:       EKG:     EKG Interpretation Date/Time:  Monday April 10 2023 13:58:57 EDT Ventricular Rate:  69 PR Interval:  174 QRS Duration:  98 QT  Interval:  414 QTC Calculation: 443 R Axis:   -75  Text Interpretation: Normal sinus rhythm Left axis deviation Incomplete right bundle branch block No previous ECGs available Confirmed by Kristeen Miss ([id number]) on 04/10/2023 4:47:15 PM    Recent Labs: No results found for requested labs within last 365 days.  Recent Lipid Panel    Component Value Date/Time   CHOL 170 06/15/2018 0939   TRIG 93 06/15/2018 0939   HDL 62 06/15/2018 0939   CHOLHDL  2.7 06/15/2018 0939   LDLCALC 89 06/15/2018 0939    Physical Exam:     Physical Exam: Blood pressure 130/62, pulse 69, height 6\' 1"  (1.854 m), weight 181 lb (82.1 kg), SpO2 99%.       GEN:  Well nourished, well developed in no acute distress HEENT: Normal NECK: No JVD; No carotid bruits LYMPHATICS: No lymphadenopathy CARDIAC: RRR , no murmurs, rubs, gallops RESPIRATORY:  Clear to auscultation without rales, wheezing or rhonchi  ABDOMEN: Soft, non-tender, non-distended MUSCULOSKELETAL:  No edema; No deformity  SKIN: Warm and dry NEUROLOGIC:  Alert and oriented x 3    ASSESSMENT:    1. Aneurysm of ascending aorta without rupture (HCC)   2. Type 2 diabetes mellitus with complication, without long-term current use of insulin (HCC)      PLAN:      1.   Ascending aortid dilitation -Bill has mild dilatation of his ascending aorta.  4.2 to 4.3 cm.  It is basically unchanged from 2022.  Will repeat the scan.   2.  Hyperlipidemia:     3. Coronary Calcium -.     4.  Diabetes  -his diabetes is poorly controlled.  He has started drinking again.  He had significant GI issues with metformin and stopped it.   Will refer him to Dr. Elvera Lennox for further assistance Start farxiga 10 mg a day      Medication Adjustments/Labs and Tests Ordered: Current medicines are reviewed at length with the patient today.  Concerns regarding medicines are outlined above.  Orders Placed This Encounter  Procedures   CT ANGIO CHEST AORTA W/CM & OR  WO/CM   Ambulatory referral to Endocrinology   EKG 12-Lead   Meds ordered this encounter  Medications   dapagliflozin propanediol (FARXIGA) 10 MG TABS tablet    Sig: Take 1 tablet (10 mg total) by mouth daily before breakfast.    Dispense:  30 tablet    Refill:  11     Signed, Kristeen Miss, MD  04/10/2023 4:47 PM    [facility]

## 2023-04-10 ENCOUNTER — Ambulatory Visit: Payer: BC Managed Care – PPO | Attending: Cardiovascular Disease | Admitting: Cardiovascular Disease

## 2023-04-10 ENCOUNTER — Encounter: Payer: Self-pay | Admitting: Cardiovascular Disease

## 2023-04-10 VITALS — BP 130/62 | HR 69 | Ht 73.0 in | Wt 181.0 lb

## 2023-04-10 DIAGNOSIS — E118 Type 2 diabetes mellitus with unspecified complications: Secondary | ICD-10-CM

## 2023-04-10 DIAGNOSIS — I7121 Aneurysm of the ascending aorta, without rupture: Secondary | ICD-10-CM | POA: Diagnosis not present

## 2023-04-10 MED ORDER — DAPAGLIFLOZIN PROPANEDIOL 10 MG PO TABS: 10.0000 mg | ORAL_TABLET | Freq: Every day | ORAL | 11 refills | Status: DC

## 2023-04-10 NOTE — Patient Instructions (Addendum)
Medication Instructions:  RESTART Farxiga 10mg  daily *If you need a refill on your cardiac medications before your next appointment, please call your pharmacy*   Lab Work: NONE If you have labs (blood work) drawn today and your tests are completely normal, you will receive your results only by: MyChart Message (if you have MyChart) OR A paper copy in the mail If you have any lab test that is abnormal or we need to change your treatment, we will call you to review the results.   Testing/Procedures: Ambulatory referral to Endocrinology  CTA of Aorta Non-Cardiac CT Angiography (CTA), is a special type of CT scan that uses a computer to produce multi-dimensional views of major blood vessels throughout the body. In CT angiography, a contrast material is injected through an IV to help visualize the blood vessels  Follow-Up: At [facility], you and your health needs are our priority.  As part of our continuing mission to provide you with exceptional heart care, we have created designated Provider Care Teams.  These Care Teams include your primary Cardiologist (physician) and Advanced Practice Providers (APPs -  Physician Assistants and Nurse Practitioners) who all work together to provide you with the care you need, when you need it.   Your next appointment:   1 year(s)  Provider:   Kristeen Miss, MD

## 2023-04-13 ENCOUNTER — Encounter (HOSPITAL_BASED_OUTPATIENT_CLINIC_OR_DEPARTMENT_OTHER): Payer: Self-pay

## 2023-04-13 ENCOUNTER — Ambulatory Visit (HOSPITAL_BASED_OUTPATIENT_CLINIC_OR_DEPARTMENT_OTHER)
Admission: RE | Admit: 2023-04-13 | Discharge: 2023-04-13 | Disposition: A | Discharge status: Home / Self Care | Payer: BC Managed Care – PPO | Source: Ambulatory Visit | Attending: Cardiovascular Disease | Admitting: Cardiovascular Disease

## 2023-04-13 DIAGNOSIS — E118 Type 2 diabetes mellitus with unspecified complications: Secondary | ICD-10-CM | POA: Diagnosis not present

## 2023-04-13 DIAGNOSIS — E785 Hyperlipidemia, unspecified: Secondary | ICD-10-CM | POA: Diagnosis not present

## 2023-04-13 DIAGNOSIS — I2510 Atherosclerotic heart disease of native coronary artery without angina pectoris: Secondary | ICD-10-CM | POA: Diagnosis not present

## 2023-04-13 DIAGNOSIS — I7121 Aneurysm of the ascending aorta, without rupture: Secondary | ICD-10-CM | POA: Insufficient documentation

## 2023-04-13 LAB — POCT I-STAT CREATININE: Creatinine, Ser: 1.1 mg/dL (ref 0.61–1.24)

## 2023-04-13 MED ORDER — IOHEXOL 350 MG/ML SOLN
100.0000 mL | Freq: Once | INTRAVENOUS | Status: AC | PRN
  Administered 2023-04-13: 75 mL via INTRAVENOUS

## 2023-04-17 ENCOUNTER — Encounter (HOSPITAL_BASED_OUTPATIENT_CLINIC_OR_DEPARTMENT_OTHER): Payer: Self-pay | Admitting: Family Medicine

## 2023-04-17 ENCOUNTER — Ambulatory Visit (HOSPITAL_BASED_OUTPATIENT_CLINIC_OR_DEPARTMENT_OTHER): Payer: BC Managed Care – PPO | Admitting: Family Medicine

## 2023-04-17 VITALS — BP 127/71 | HR 61 | Ht 73.0 in | Wt 178.0 lb

## 2023-04-17 DIAGNOSIS — I10 Essential (primary) hypertension: Secondary | ICD-10-CM

## 2023-04-17 DIAGNOSIS — E1165 Type 2 diabetes mellitus with hyperglycemia: Secondary | ICD-10-CM | POA: Diagnosis not present

## 2023-04-17 DIAGNOSIS — Z7689 Persons encountering health services in other specified circumstances: Secondary | ICD-10-CM | POA: Diagnosis not present

## 2023-04-17 DIAGNOSIS — E119 Type 2 diabetes mellitus without complications: Secondary | ICD-10-CM | POA: Insufficient documentation

## 2023-04-17 HISTORY — DX: Essential (primary) hypertension: I10

## 2023-04-17 NOTE — Progress Notes (Signed)
New Patient Office Visit  Subjective:   Marcus Harvey [birth year] 04/17/2023    HPI: Marcus Harvey presents today to establish care at Primary [facility] Medicine at [facility]. Introduced to Publishing rights manager role and practice setting.  All questions answered.   Last PCP: Marcus Hazel, MD at [facility]  Last annual physical: In the past year per patient. Will obtain records.  Concerns: See below    DIABETES MELLITUS: Marcus Harvey presents for the medical management of diabetes. Patient reports his diet and alcohol intake has been uncontrolled in the past several months and contributed to diagnosis of HLD and Type 2 diabetes mellitus. He states he is working out 4-5x a DAY, cycles, does handyman work regularly.   He has made dietary changes in the past 2 weeks. States he has stopped eating processed and junk food. Increased in veggies and fruits instead. He started [place] approx. 1 week ago. He is seeing an IT trainer on 04/26/23.   Current diabetes medication regimen: Farxiga 10mg  Patient is  adhering to a diabetic diet.  Patient is  exercising regularly.  Patient is not checking BS regularly.  Patient is  checking their feet regularly.  Denies polydipsia, polyphagia, polyuria, open wounds or ulcers on feet.   Self Reported A1C from [facility] : 7.0 in August 2024  Lab Results  Component Value Date   HGBA1C 6.4 (H) 12/08/2017    Wt Readings from Last 3 Encounters:  04/17/23 178 lb (80.7 kg)  04/10/23 181 lb (82.1 kg)  02/22/22 183 lb 9.6 oz (83.3 kg)    HYPERTENSION: Marcus Harvey presents for the medical management of hypertension.  Patient's current hypertension medication regimen is: Losartan 25mg  Patient is  currently taking prescribed medications for HTN.  Patient is  regularly keeping a check on BP at home.  Adhering to low sodium diet: Yes Exercising Regularly: Yes Denies headache, dizziness, CP, SHOB, vision  changes.   BP Readings from Last 3 Encounters:  04/17/23 127/71  04/10/23 130/62  02/22/22 (!) 142/80     The following portions of the patient's history were reviewed and updated as appropriate: past medical history, past surgical history, family history, social history, allergies, medications, and problem list.   Patient Active Problem List   Diagnosis Date Noted   Type 2 diabetes mellitus with hyperglycemia, without long-term current use of insulin (HCC) 04/17/2023   Primary hypertension 04/17/2023   Thoracic aortic aneurysm without rupture (HCC) 04/15/2019   Hyperlipidemia 07/12/2017   Incomplete RBBB 07/12/2017   Past Medical History:  Diagnosis Date   ADD (attention deficit disorder)    Hx of hemorrhoids    Hypercholesterolemia    high TG 1/10, 1/12, 3/13, 3/15, high LDL, TG consider statin ASCVD risk 7-5%, usual risk 3.6%, risk 6%, LDL. 168 11/17, recheck 6 mos.    Hyperlipidemia 07/12/2017   Incomplete RBBB 07/12/2017   Past Surgical History:  Procedure Laterality Date   APPENDECTOMY     SHOULDER SURGERY Left    multiple dislocations    Family History  Problem Relation Age of Onset   Cancer Brother 53       head, neck   Diabetes Mellitus I Paternal Grandfather    Social History   Socioeconomic History   Marital status: Married    Spouse name: Not on file   Number of children: 2   Years of education: college    Highest education level: Some college, no degree  Occupational History   Occupation:  sales contracting/business owner  Tobacco Use   Smoking status: Never   Smokeless tobacco: Never  Vaping Use   Vaping status: Never Used  Substance and Sexual Activity   Alcohol use: Yes   Drug use: No   Sexual activity: Not on file  Other Topics Concern   Not on file  Social History Narrative   Not on file   Social Determinants of Health   Financial Resource Strain: Low Risk  (04/13/2023)   Overall Financial Resource Strain (CARDIA)    Difficulty of  Paying Living Expenses: Not hard at all  Food Insecurity: No Food Insecurity (04/13/2023)   Hunger Vital Sign    Worried About Running Out of Food in the Last Year: Never true    Ran Out of Food in the Last Year: Never true  Transportation Needs: No Transportation Needs (04/13/2023)   PRAPARE - Administrator, Civil Service (Medical): No    Lack of Transportation (Non-Medical): No  Physical Activity: Sufficiently Active (04/13/2023)   Exercise Vital Sign    Days of Exercise per Week: 6 days    Minutes of Exercise per Session: 60 min  Stress: No Stress Concern Present (04/13/2023)   [employer] of Occupational Health - Occupational Stress Questionnaire    Feeling of Stress : Not at all  Social Connections: Moderately Integrated (04/13/2023)   Social Connection and Isolation Panel [NHANES]    Frequency of Communication with Friends and Family: More than three times a week    Frequency of Social Gatherings with Friends and Family: More than three times a week    Attends Religious Services: More than 4 times per year    Active Member of [employer] or Organizations: No    Attends Engineer, structural: Not on file    Marital Status: Married  Catering manager Violence: Not on file   Outpatient Medications Prior to Visit  Medication Sig Dispense Refill   atorvastatin (LIPITOR) 40 MG tablet Take 1 tablet (40 mg total) by mouth daily. Please make overdue appt with Dr. Elease Hashimoto before anymore refills. Thank you 1st attempt 30 tablet 0   dapagliflozin propanediol (FARXIGA) 10 MG TABS tablet Take 1 tablet (10 mg total) by mouth daily before breakfast. 30 tablet 11   losartan (COZAAR) 25 MG tablet Take 25 mg by mouth daily.     fenofibrate (TRICOR) 145 MG tablet TAKE 1 TABLET BY MOUTH EVERY DAY (Patient not taking: Reported on 04/17/2023) 90 tablet 3   metFORMIN (GLUCOPHAGE-XR) 500 MG 24 hr tablet Take 1 tablet (500 mg total) by mouth 2 (two) times daily with a meal. 180 tablet 3    No facility-administered medications prior to visit.   No Known Allergies  ROS: A complete ROS was performed with pertinent positives/negatives noted in the HPI. The remainder of the ROS are negative.   Objective:   Today's Vitals   04/17/23 1039  BP: 127/71  Pulse: 61  SpO2: 100%  Weight: 178 lb (80.7 kg)  Height: 6\' 1"  (1.854 m)    GENERAL: Well-appearing, in NAD. Well nourished.  SKIN: Pink, warm and dry. No rash, lesion, ulceration, or ecchymoses.  Head: Normocephalic. NECK: Trachea midline. Full ROM w/o pain or tenderness. No lymphadenopathy.  RESPIRATORY: Chest wall symmetrical. Respirations even and non-labored. Breath sounds clear to auscultation bilaterally.  CARDIAC: S1, S2 present, regular rate and rhythm without murmur or gallops. Peripheral pulses 2+ bilaterally.  MSK: Muscle tone and strength appropriate for age. Joints w/o tenderness, redness,  or swelling.  EXTREMITIES: Without clubbing, cyanosis, or edema.  NEUROLOGIC: No motor or sensory deficits. Steady, even gait. C2-C12 intact.  PSYCH/MENTAL STATUS: Alert, oriented x 3. Cooperative, appropriate mood and affect.   Diabetic Foot Exam - Simple   Simple Foot Form Diabetic Foot exam was performed with the following findings: Yes 04/17/2023 11:30 AM  Visual Inspection No deformities, no ulcerations, no other skin breakdown bilaterally: Yes Sensation Testing Intact to touch and monofilament testing bilaterally: Yes Pulse Check Posterior Tibialis and Dorsalis pulse intact bilaterally: Yes Comments     Health Maintenance Due  Topic Date Due   OPHTHALMOLOGY EXAM  Never done   HIV Screening  Never done   Diabetic kidney evaluation - Urine ACR  Never done   Hepatitis C Screening  Never done   DTaP/Tdap/Td (1 - Tdap) Never done   Colonoscopy  Never done   HEMOGLOBIN A1C  05/31/2019   Zoster Vaccines- Shingrix (2 of 2) 01/10/2020   Diabetic kidney evaluation - eGFR measurement  03/18/2022   COVID-19  Vaccine (3 - 2023-24 season) 04/29/2022   INFLUENZA VACCINE  03/30/2023   Pneumonia Vaccine 32+ Years old (1 of 1 - PCV) 04/02/2023    No results found for any visits on 04/17/23.     Assessment & Plan:  1. Encounter to establish care with new doctor Discussed role of PCP with patient and preventative screenings. Pt states he is UTD on screenings and labs with [facility]. Electronic record request completed with patient's consent.   2. Type 2 diabetes mellitus with hyperglycemia, without long-term current use of insulin (HCC) Urine albumin and foot exam completed today in office.  Patient to continue Farxiga 10 mg daily.  Discussed good diet and exercise changes with patient and he is encouraged.  Will repeat A1c and renal function in approximately 3 months or sooner pending review of patient's lab and visit from [facility] physicians with records request. - Microalbumin / creatinine urine ratio  3. Primary hypertension Stable, continue losartan 25 mg daily.  Patient is managed by cardiology for his AAA.   Patient to reach out to office if new, worrisome, or unresolved symptoms arise or if no improvement in patient's condition. Patient verbalized understanding and is agreeable to treatment plan. All questions answered to patient's satisfaction.    Return in about 3 months (around 07/18/2023) for DIABETES CHECK UP .   Of note, portions of this note may have been created with voice recognition software Physicist, medical). While this note has been edited for accuracy, occasional wrong-word or 'sound-a-like' substitutions may have occurred due to the inherent limitations of voice recognition software.  Yolanda Manges, FNP

## 2023-04-17 NOTE — Patient Instructions (Signed)
Get your 2nd Shingrix Vaccine   Get your Pneumonia vaccine

## 2023-04-18 LAB — MICROALBUMIN / CREATININE URINE RATIO
Creatinine, Urine: 53.3 mg/dL
Microalb/Creat Ratio: 13 mg/g{creat} (ref 0–29)
Microalbumin, Urine: 7.1 ug/mL

## 2023-04-18 NOTE — Progress Notes (Signed)
Urine albumin WNL. Repeat annually.

## 2023-04-26 ENCOUNTER — Encounter: Payer: Self-pay | Admitting: Internal Medicine

## 2023-04-26 ENCOUNTER — Ambulatory Visit: Payer: BC Managed Care – PPO | Admitting: Internal Medicine

## 2023-04-26 ENCOUNTER — Other Ambulatory Visit (HOSPITAL_BASED_OUTPATIENT_CLINIC_OR_DEPARTMENT_OTHER): Payer: Self-pay

## 2023-04-26 VITALS — BP 122/70 | HR 63 | Ht 73.0 in | Wt 179.0 lb

## 2023-04-26 DIAGNOSIS — Z7984 Long term (current) use of oral hypoglycemic drugs: Secondary | ICD-10-CM

## 2023-04-26 DIAGNOSIS — E1159 Type 2 diabetes mellitus with other circulatory complications: Secondary | ICD-10-CM

## 2023-04-26 MED ORDER — DAPAGLIFLOZIN PROPANEDIOL 10 MG PO TABS
10.0000 mg | ORAL_TABLET | Freq: Every day | ORAL | 3 refills | Status: DC
  Filled 2023-04-26 – 2023-05-03 (×2): qty 90, 90d supply, fill #0
  Filled 2023-08-31: qty 90, 90d supply, fill #1

## 2023-04-26 MED ORDER — FREESTYLE LIBRE 3 SENSOR MISC
1.0000 | 3 refills | Status: DC
Start: 2023-04-26 — End: 2023-07-18
  Filled 2023-04-26: qty 6, 84d supply, fill #0

## 2023-04-26 NOTE — Progress Notes (Signed)
Patient ID: Marcus Harvey, male   DOB: 1956-04-07, 67 y.o.   MRN: [id number]  HPI: Marcus Harvey is a 67 y.o.-year-old male, referred by Dr. Elease Hashimoto for DM2, dx in 03/2023, non-insulin-dependent, uncontrolled, with complications (CAD).   Pt. mentions that he has a several year history of prediabetes, but with a higher HbA1c obtained at last visit with PCP earlier this month.  He was not surprised about this finding, as he mentions that he had a bad summer in which he relaxed his diet, restarted drinking alcohol, and was not taking his medications.  He does report a long history of alcohol use, but he was able to stop completely in the last 3 months weeks.  He also started to improve his diet.  Reviewed HbA1c: 04/05/2023: HbA1c 7.1% Lab Results  Component Value Date   HGBA1C 6.4 (H) 12/08/2017   Pt is on a regimen of: - Farxiga 10 mg in am -added 04/10/2023 He was previously on metformin 500 mg daily but stopped due to side effects - N/D.  He is not checking blood sugars.  Does not have a glucometer.  Pt's meals are: - Breakfast: blueberries, bran flakes, 2% milk; tea - Lunch: salads, chicken - Dinner: salads, rotisserie chicken He stopped drinking alcohol 08/2021, but restarted. He is cycling for exercise (approximately 60 miles a week) and also goes to the gym.  - no CKD, last BUN/creatinine:  04/05/2023: 15/1.1, GFR 91, glucose 204 Lab Results  Component Value Date   BUN 16 03/18/2021   BUN 17 03/27/2019   CREATININE 1.10 04/13/2023   CREATININE 0.90 03/08/2022   Lab Results  Component Value Date   MICRALBCREAT 13 04/17/2023  He is on Cozaar 25 mg daily.  -+ HL; last set of lipids: 04/05/2023: 269/309/51/160 Lab Results  Component Value Date   CHOL 170 06/15/2018   HDL 62 06/15/2018   LDLCALC 89 06/15/2018   TRIG 93 06/15/2018   CHOLHDL 2.7 06/15/2018  He is on Lipitor 40 mg daily and fenofibrate 145 mg daily - was off medications at the time of the last lipid  panel.  - last eye exam was on 09/28/2022. No DR.   - no numbness and tingling in his feet.  Last foot exam 04/17/2023.  He also has a history of HTN, AAA, incomplete right bundle branch block, ADD. Coronary artery calcium score 38.  He has DM in MGF and PGF.   ROS: + see HPI No increased urination, blurry vision, nausea, chest pain. No chest pain, shortness of breath.  Past Medical History:  Diagnosis Date   ADD (attention deficit disorder)    Hx of hemorrhoids    Hypercholesterolemia    high TG 1/10, 1/12, 3/13, 3/15, high LDL, TG consider statin ASCVD risk 7-5%, usual risk 3.6%, risk 6%, LDL. 168 11/17, recheck 6 mos.    Hyperlipidemia 07/12/2017   Incomplete RBBB 07/12/2017   Past Surgical History:  Procedure Laterality Date   APPENDECTOMY     SHOULDER SURGERY Left    multiple dislocations    Social History   Socioeconomic History   Marital status: Married    Spouse name: Not on file   Number of children: 2   Years of education: college    Highest education level: Some college, no degree  Occupational History   Occupation: Marketing executive  Tobacco Use   Smoking status: Never   Smokeless tobacco: Never  Vaping Use   Vaping status: Never Used  Substance and Sexual Activity  Alcohol use: Yes   Drug use: No   Sexual activity: Not on file  Other Topics Concern   Not on file  Social History Narrative   Not on file   Social Determinants of Health   Financial Resource Strain: Low Risk  (04/13/2023)   Overall Financial Resource Strain (CARDIA)    Difficulty of Paying Living Expenses: Not hard at all  Food Insecurity: No Food Insecurity (04/13/2023)   Hunger Vital Sign    Worried About Running Out of Food in the Last Year: Never true    Ran Out of Food in the Last Year: Never true  Transportation Needs: No Transportation Needs (04/13/2023)   PRAPARE - Administrator, Civil Service (Medical): No    Lack of Transportation  (Non-Medical): No  Physical Activity: Sufficiently Active (04/13/2023)   Exercise Vital Sign    Days of Exercise per Week: 6 days    Minutes of Exercise per Session: 60 min  Stress: No Stress Concern Present (04/13/2023)   [employer] of Occupational Health - Occupational Stress Questionnaire    Feeling of Stress : Not at all  Social Connections: Moderately Integrated (04/13/2023)   Social Connection and Isolation Panel [NHANES]    Frequency of Communication with Friends and Family: More than three times a week    Frequency of Social Gatherings with Friends and Family: More than three times a week    Attends Religious Services: More than 4 times per year    Active Member of [employer] or Organizations: No    Attends Engineer, structural: Not on file    Marital Status: Married  Catering manager Violence: Not on file   Current Outpatient Medications on File Prior to Visit  Medication Sig Dispense Refill   atorvastatin (LIPITOR) 40 MG tablet Take 1 tablet (40 mg total) by mouth daily. Please make overdue appt with Dr. Elease Hashimoto before anymore refills. Thank you 1st attempt 30 tablet 0   dapagliflozin propanediol (FARXIGA) 10 MG TABS tablet Take 1 tablet (10 mg total) by mouth daily before breakfast. 30 tablet 11   losartan (COZAAR) 25 MG tablet Take 25 mg by mouth daily.     No current facility-administered medications on file prior to visit.   No Known Allergies Family History  Problem Relation Age of Onset   Cancer Brother 5       head, neck   Diabetes Mellitus I Paternal Grandfather    PE: BP 122/70   Pulse 63   Ht 6\' 1"  (1.854 m)   Wt 179 lb (81.2 kg)   SpO2 99%   BMI 23.62 kg/m  Wt Readings from Last 3 Encounters:  04/26/23 179 lb (81.2 kg)  04/17/23 178 lb (80.7 kg)  04/10/23 181 lb (82.1 kg)   Constitutional: normal weight, in NAD Eyes:  EOMI, no exophthalmos ENT: no neck masses, no cervical lymphadenopathy Cardiovascular: RRR, No MRG Respiratory: CTA  B Musculoskeletal: no deformities Skin:no rashes Neurological: no tremor with outstretched hands  ASSESSMENT: 1. DM2, non-insulin-dependent, uncontrolled, with complications - CAD (coronary artery calcification, AAA)  2. HL  PLAN:  1. Patient with long-standing, uncontrolled diabetes, on oral antidiabetic regimen with SGLT2 inhibitor added 2 weeks ago by his cardiologist, with suboptimal control.  Latest HbA1c was 7.1% at last visit with PCP -He tried metformin in the past but could not tolerate it due to GI symptoms. -He gives a long history of alcohol use and dietary indiscretions.  He is usually exercising consistently and  tries to stay fit.  However, over the summer, he relaxes alcohol intake and also his diet.  While his previous HbA1c levels have been lower, in the prediabetic range, the new level obtained 3 weeks ago was higher, in the diabetic range.  He met with his cardiologist 2 weeks ago and Marcelline Deist was added at that time.  He is tolerating it well.  He is drinking plenty of water. -At today's visit, he tells me he does not have a glucometer and he is not checking blood sugars.  We discussed about the importance of doing so.  I advised him to rotate the check times.  We will call in a prescription for the glucometer to the pharmacy but upon his questioning, I believe that he would benefit from a CGM.  I sent a prescription for the freestyle libre 3 CGM to his pharmacy.  I am hoping that he can obtain it. -We also discussed about ways to improve his diet.  He already started to do so.  He is eating a quasi keto diet later in the day, with cereals in the morning.  I advised him to cut out milk, replace it with almond milk, and add healthy fats to his breakfast including nuts, Chia seeds, flaxseeds.  We also discussed about eating as many whole foods as possible.  Given him the reference of an application to check the nutritious value of certain foods. -For now, my suggestion would be to  start checking blood sugars, continue to improve the diet, stay off alcohol, and continue Farxiga, not only for diabetes control, but also for cardiovascular, liver, and kidney outcomes.  He agrees with this.  I refilled his prescription. - I suggested to:  Patient Instructions  Please continue: - Farxiga 10 mg before b'fast  Try to start the CGM.  You will need a glucometer, also.  Use the "Yuka" app.  STOP MILK.  Please return in 3 months with your sugar log.   - discussed about CBG targets for treatment: 80-130 mg/dL before meals and <098 mg/dL after meals; target JXB1Y <7%. - given sugar log and advised how to fill it and to bring it at next appt  - given foot care handout  - given instructions for hypoglycemia management "15-15 rule"  - he has yearly eye exams and now also establish care with a podiatrist. - Return to clinic in 3 months  2. HL - Reviewed latest lipid panel from 03/2023: LDL significantly elevated (160) and above our target of less than 55 due to history of cardiovascular disease.  Also, triglycerides were very high, at 309.  Upon questioning, he was not taking his cholesterol medications at that time.  Moreover, he stopped alcohol afterwards. - Continues Lipitor 40 mg daily and fenofibrate 145 mg daily without side effects   Carlus Pavlov, MD PhD [facility] Endocrinology

## 2023-04-26 NOTE — Patient Instructions (Addendum)
Please continue: - Farxiga 10 mg before b'fast  Try to start the CGM.  You will need a glucometer, also.  Use the "Yuka" app.  STOP MILK.  Please return in 3 months with your sugar log.   PATIENT INSTRUCTIONS FOR TYPE 2 DIABETES:  DIET AND EXERCISE Diet and exercise is an important part of diabetic treatment.  We recommended aerobic exercise in the form of brisk walking (working between 40-60% of maximal aerobic capacity, similar to brisk walking) for 150 minutes per week (such as 30 minutes five days per week) along with 3 times per week performing 'resistance' training (using various gauge rubber tubes with handles) 5-10 exercises involving the major muscle groups (upper body, lower body and core) performing 10-15 repetitions (or near fatigue) each exercise. Start at half the above goal but build slowly to reach the above goals. If limited by weight, joint pain, or disability, we recommend daily walking in a swimming pool with water up to waist to reduce pressure from joints while allow for adequate exercise.    BLOOD GLUCOSES Monitoring your blood glucoses is important for continued management of your diabetes. Please check your blood glucoses 2-4 times a day: fasting, before meals and at bedtime (you can rotate these measurements - e.g. one day check before the 3 meals, the next day check before 2 of the meals and before bedtime, etc.).   HYPOGLYCEMIA (low blood sugar) Hypoglycemia is usually a reaction to not eating, exercising, or taking too much insulin/ other diabetes drugs.  Symptoms include tremors, sweating, hunger, confusion, headache, etc. Treat IMMEDIATELY with 15 grams of Carbs: 4 glucose tablets  cup regular juice/soda 2 tablespoons raisins 4 teaspoons sugar 1 tablespoon honey Recheck blood glucose in 15 mins and repeat above if still symptomatic/blood glucose <100.  RECOMMENDATIONS TO REDUCE YOUR RISK OF DIABETIC COMPLICATIONS: * Take your prescribed  MEDICATION(S) * Follow a DIABETIC diet: Complex carbs, fiber rich foods, (monounsaturated and polyunsaturated) fats * AVOID saturated/trans fats, high fat foods, >2,300 mg salt per day. * EXERCISE at least 5 times a week for 30 minutes or preferably daily.  * DO NOT SMOKE OR DRINK more than 1 drink a day. * Check your FEET every day. Do not wear tightfitting shoes. Contact us if you develop an ulcer * See your EYE doctor once a year or more if needed * Get a FLU shot once a year * Get a PNEUMONIA vaccine once before and once after age 73 years  GOALS:  * Your Hemoglobin A1c of <7%  * fasting sugars need to be 80-130 * after meals sugars need to be <180 (2h after you start eating) * Your Systolic BP should be 130 or lower  * Your Diastolic BP should be 80 or lower  * Your HDL (Good Cholesterol) should be 40 or higher  * Your LDL (Bad Cholesterol) should be ideally <70. * Your Triglycerides should be 150 or lower  * Your Urine microalbumin (kidney function) should be <30 * Your Body Mass Index should be 25 or lower   Please consider the following ways to cut down carbs and fat and increase fiber and micronutrients in your diet: - substitute whole grain for white bread or pasta - substitute brown rice for white rice - substitute 90-calorie flat bread pieces for slices of bread when possible - substitute sweet potatoes or yams for white potatoes - substitute humus for margarine - substitute tofu for cheese when possible - substitute almond or rice milk for  regular milk (would not drink soy milk daily due to concern for soy estrogen influence on breast cancer risk) - substitute dark chocolate for other sweets when possible - substitute water - can add lemon or orange slices for taste - for diet sodas (artificial sweeteners will trick your body that you can eat sweets without getting calories and will lead you to overeating and weight gain in the long run) - do not skip breakfast or other  meals (this will slow down the metabolism and will result in more weight gain over time)  - can try smoothies made from fruit and almond/rice milk in am instead of regular breakfast - can also try old-fashioned (not instant) oatmeal made with almond/rice milk in am - order the dressing on the side when eating salad at a restaurant (pour less than half of the dressing on the salad) - eat as little meat as possible - can try juicing, but should not forget that juicing will get rid of the fiber, so would alternate with eating raw veg./fruits or drinking smoothies - use as little oil as possible, even when using olive oil - can dress a salad with a mix of balsamic vinegar and lemon juice, for e.g. - use agave nectar, stevia sugar, or regular sugar rather than artificial sweateners - steam or broil/roast veggies  - snack on veggies/fruit/nuts (unsalted, preferably) when possible, rather than processed foods - reduce or eliminate aspartame in diet (it is in diet sodas, chewing gum, etc) Read the labels!  Try to read Dr. Katherina Right book: "Program for Reversing Diabetes" for other ideas for healthy eating.

## 2023-04-27 ENCOUNTER — Encounter (HOSPITAL_BASED_OUTPATIENT_CLINIC_OR_DEPARTMENT_OTHER): Payer: Self-pay | Admitting: Family Medicine

## 2023-04-27 ENCOUNTER — Other Ambulatory Visit (HOSPITAL_BASED_OUTPATIENT_CLINIC_OR_DEPARTMENT_OTHER): Payer: Self-pay

## 2023-04-27 ENCOUNTER — Other Ambulatory Visit (HOSPITAL_BASED_OUTPATIENT_CLINIC_OR_DEPARTMENT_OTHER): Payer: Self-pay | Admitting: Family Medicine

## 2023-04-27 DIAGNOSIS — M349 Systemic sclerosis, unspecified: Secondary | ICD-10-CM | POA: Insufficient documentation

## 2023-04-27 DIAGNOSIS — I2510 Atherosclerotic heart disease of native coronary artery without angina pectoris: Secondary | ICD-10-CM | POA: Insufficient documentation

## 2023-04-27 DIAGNOSIS — F900 Attention-deficit hyperactivity disorder, predominantly inattentive type: Secondary | ICD-10-CM | POA: Insufficient documentation

## 2023-04-27 DIAGNOSIS — F411 Generalized anxiety disorder: Secondary | ICD-10-CM | POA: Insufficient documentation

## 2023-04-27 DIAGNOSIS — I7121 Aneurysm of the ascending aorta, without rupture: Secondary | ICD-10-CM | POA: Insufficient documentation

## 2023-04-27 DIAGNOSIS — K635 Polyp of colon: Secondary | ICD-10-CM | POA: Insufficient documentation

## 2023-04-27 DIAGNOSIS — I7300 Raynaud's syndrome without gangrene: Secondary | ICD-10-CM | POA: Insufficient documentation

## 2023-04-27 DIAGNOSIS — E781 Pure hyperglyceridemia: Secondary | ICD-10-CM | POA: Insufficient documentation

## 2023-05-03 ENCOUNTER — Other Ambulatory Visit: Payer: Self-pay

## 2023-05-03 ENCOUNTER — Other Ambulatory Visit (HOSPITAL_BASED_OUTPATIENT_CLINIC_OR_DEPARTMENT_OTHER): Payer: Self-pay

## 2023-05-29 ENCOUNTER — Other Ambulatory Visit (HOSPITAL_BASED_OUTPATIENT_CLINIC_OR_DEPARTMENT_OTHER): Payer: Self-pay

## 2023-06-19 ENCOUNTER — Encounter (HOSPITAL_BASED_OUTPATIENT_CLINIC_OR_DEPARTMENT_OTHER): Payer: Self-pay | Admitting: Family Medicine

## 2023-06-19 ENCOUNTER — Other Ambulatory Visit (HOSPITAL_BASED_OUTPATIENT_CLINIC_OR_DEPARTMENT_OTHER): Payer: Self-pay | Admitting: Family Medicine

## 2023-06-19 DIAGNOSIS — F40243 Fear of flying: Secondary | ICD-10-CM

## 2023-06-19 MED ORDER — LORAZEPAM 0.5 MG PO TABS: 0.5000 mg | ORAL_TABLET | Freq: Two times a day (BID) | ORAL | 0 refills | Status: DC | PRN

## 2023-07-07 ENCOUNTER — Other Ambulatory Visit: Payer: Self-pay | Admitting: Cardiovascular Disease

## 2023-07-07 ENCOUNTER — Other Ambulatory Visit (HOSPITAL_BASED_OUTPATIENT_CLINIC_OR_DEPARTMENT_OTHER): Payer: Self-pay

## 2023-07-07 MED ORDER — ATORVASTATIN CALCIUM 40 MG PO TABS
40.0000 mg | ORAL_TABLET | Freq: Every day | ORAL | 3 refills | Status: DC
  Filled 2023-07-07: qty 90, 90d supply, fill #0
  Filled 2023-10-16: qty 90, 90d supply, fill #1
  Filled 2024-01-23: qty 90, 90d supply, fill #2
  Filled 2024-04-25: qty 90, 90d supply, fill #3

## 2023-07-12 ENCOUNTER — Other Ambulatory Visit (HOSPITAL_BASED_OUTPATIENT_CLINIC_OR_DEPARTMENT_OTHER): Payer: Self-pay

## 2023-07-13 ENCOUNTER — Other Ambulatory Visit (HOSPITAL_BASED_OUTPATIENT_CLINIC_OR_DEPARTMENT_OTHER): Payer: Self-pay

## 2023-07-18 ENCOUNTER — Ambulatory Visit (HOSPITAL_BASED_OUTPATIENT_CLINIC_OR_DEPARTMENT_OTHER): Payer: BC Managed Care – PPO | Admitting: Family Medicine

## 2023-07-18 ENCOUNTER — Encounter (HOSPITAL_BASED_OUTPATIENT_CLINIC_OR_DEPARTMENT_OTHER): Payer: Self-pay | Admitting: Family Medicine

## 2023-07-18 VITALS — BP 129/70 | HR 62 | Ht 73.0 in | Wt 171.3 lb

## 2023-07-18 DIAGNOSIS — E1165 Type 2 diabetes mellitus with hyperglycemia: Secondary | ICD-10-CM | POA: Diagnosis not present

## 2023-07-18 DIAGNOSIS — E782 Mixed hyperlipidemia: Secondary | ICD-10-CM

## 2023-07-18 DIAGNOSIS — Z0000 Encounter for general adult medical examination without abnormal findings: Secondary | ICD-10-CM | POA: Diagnosis not present

## 2023-07-18 DIAGNOSIS — I10 Essential (primary) hypertension: Secondary | ICD-10-CM

## 2023-07-18 LAB — POCT GLYCOSYLATED HEMOGLOBIN (HGB A1C)
HbA1c, POC (controlled diabetic range): 6.5 % (ref 0.0–7.0)
Hemoglobin A1C: 6.5 % — AB (ref 4.0–5.6)

## 2023-07-18 NOTE — Patient Instructions (Signed)
  Mr. Boyden , Thank you for taking time to come for your Medicare Wellness Visit. I appreciate your ongoing commitment to your health goals. Please review the following plan we discussed and let me know if I can assist you in the future.   These are the goals we discussed:  Goals      CCM:  Monitor and Maintain HbA1c <8%        This is a list of the screening recommended for you and due dates:  Health Maintenance  Topic Date Due   Zoster (Shingles) Vaccine (2 of 2) 01/10/2020   COVID-19 Vaccine (3 - 2023-24 season) 08/03/2023*   Flu Shot  11/27/2023*   Hepatitis C Screening  07/17/2024*   HIV Screening  07/17/2024*   Eye exam for diabetics  09/29/2023   Hemoglobin A1C  01/15/2024   Yearly kidney function blood test for diabetes  04/04/2024   Yearly kidney health urinalysis for diabetes  04/16/2024   Complete foot exam   04/16/2024   Medicare Annual Wellness Visit  07/17/2024   DTaP/Tdap/Td vaccine (3 - Td or Tdap) 08/07/2028   Colon Cancer Screening  09/30/2031   Pneumonia Vaccine  Completed   HPV Vaccine  Aged Out  *Topic was postponed. The date shown is not the original due date.

## 2023-07-18 NOTE — Progress Notes (Signed)
Subjective:    Marcus Harvey is a 67 y.o. male who presents for a Welcome to Medicare exam.   Cardiac Risk Factors include: advanced age (>28men, >58 women);diabetes mellitus;male gender     Objective:    Today's Vitals   07/18/23 1030  BP: 129/70  Pulse: 62  SpO2: 100%  Weight: 171 lb 4.8 oz (77.7 kg)  Height: 6\' 1"  (1.854 m)   Body mass index is 22.6 kg/m.  Medications Outpatient Encounter Medications as of 07/18/2023  Medication Sig   atorvastatin (LIPITOR) 40 MG tablet Take 1 tablet (40 mg total) by mouth daily. Please make overdue appt with Dr. Elease Hashimoto before anymore refills. Thank you 1st attempt   dapagliflozin propanediol (FARXIGA) 10 MG TABS tablet Take 1 tablet (10 mg total) by mouth daily before breakfast.   [DISCONTINUED] Continuous Glucose Sensor (FREESTYLE LIBRE 3 SENSOR) MISC Apply as directed every 14 (fourteen) days.   [DISCONTINUED] LORazepam (ATIVAN) 0.5 MG tablet Take 1 tablet (0.5 mg total) by mouth 2 (two) times daily as needed for anxiety.   [DISCONTINUED] losartan (COZAAR) 25 MG tablet Take 25 mg by mouth daily. (Patient not taking: Reported on 07/18/2023)   [DISCONTINUED] Multiple Vitamins-Minerals (MULTI FOR HIM) TABS as directed Orally   [DISCONTINUED] Turmeric 500 MG CAPS as directed Orally   No facility-administered encounter medications on file as of 07/18/2023.     History: Past Medical History:  Diagnosis Date   ADD (attention deficit disorder)    Hx of hemorrhoids    Hypercholesterolemia    high TG 1/10, 1/12, 3/13, 3/15, high LDL, TG consider statin ASCVD risk 7-5%, usual risk 3.6%, risk 6%, LDL. 168 11/17, recheck 6 mos.    Hyperlipidemia 07/12/2017   Incomplete RBBB 07/12/2017   Primary hypertension 04/17/2023   Past Surgical History:  Procedure Laterality Date   APPENDECTOMY     SHOULDER SURGERY Left    multiple dislocations     Family History  Problem Relation Age of Onset   Cancer Brother 67       head, neck    Diabetes Mellitus I Paternal Grandfather    Social History   Occupational History   Occupation: Marketing executive  Tobacco Use   Smoking status: Never   Smokeless tobacco: Never  Vaping Use   Vaping status: Never Used  Substance and Sexual Activity   Alcohol use: Yes   Drug use: No   Sexual activity: Not on file    Tobacco Counseling Counseling given: Not Answered   Immunizations and Health Maintenance Immunization History  Administered Date(s) Administered   Influenza Split 09/14/2009, 05/28/2014   Influenza,inj,Quad PF,6+ Mos 04/28/2021, 10/05/2022   Influenza,inj,quad, With Preservative 04/20/2015   PFIZER(Purple Top)SARS-COV-2 Vaccination 12/17/2019, 01/08/2020   PNEUMOCOCCAL CONJUGATE-20 04/28/2021   Tdap 07/02/2008, 08/07/2018   Zoster Recombinant(Shingrix) 11/15/2019   Zoster, Live 07/02/2019, 11/15/2019   Health Maintenance Due  Topic Date Due   Zoster Vaccines- Shingrix (2 of 2) 01/10/2020    Activities of Daily Living    07/18/2023   11:05 AM  In your present state of health, do you have any difficulty performing the following activities:  Hearing? 0  Vision? 0  Difficulty concentrating or making decisions? 0  Walking or climbing stairs? 0  Dressing or bathing? 0  Preparing Food and eating ? N  Using the Toilet? N  In the past six months, have you accidently leaked urine? N  Do you have problems with loss of bowel control? N  Managing  your Medications? N  Managing your Finances? N  Housekeeping or managing your Housekeeping? N    Physical Exam   Physical Exam (optional), or other factors deemed appropriate based on the beneficiary's medical and social history and current clinical standards.   Advanced Directives: Does Patient Have a Medical Advance Directive?: No Would patient like information on creating a medical advance directive?: No - Patient declined   EKG:  sinus bradycardia, left axis deviation. When compared to EKG of  04/10/23, right bundle branch block is no longer present. Ventricular rate has decreased from 69BPM.      Assessment:    This is a routine wellness  examination for this patient .   Vision/Hearing screen Hearing Screening   500Hz  1000Hz  2000Hz  4000Hz   Right ear Pass Pass Pass Pass  Left ear Pass Pass Pass Pass   Vision Screening   Right eye Left eye Both eyes  Without correction 20/20 20/20 20/20   With correction        Goals      CCM:  Monitor and Maintain HbA1c <8%         Depression Screen    07/18/2023   10:32 AM 04/17/2023   10:45 AM  PHQ 2/9 Scores  PHQ - 2 Score 0 0  PHQ- 9 Score 0      Fall Risk    07/18/2023   10:32 AM  Fall Risk   Falls in the past year? 0  Number falls in past yr: 0  Injury with Fall? 0  Risk for fall due to : No Fall Risks  Follow up Falls evaluation completed    Cognitive Function    07/18/2023   11:09 AM  MMSE - Mini Mental State Exam  Orientation to time 5  Orientation to Place 5  Registration 3  Attention/ Calculation 5  Recall 3  Language- name 2 objects 2  Language- repeat 1  Language- follow 3 step command 3  Language- read & follow direction 1  Write a sentence 1  Copy design 1  Total score 30        07/18/2023   11:10 AM  6CIT Screen  What Year? 0 points  What month? 0 points  What time? 0 points  Count back from 20 0 points  Months in reverse 0 points  Repeat phrase 0 points  Total Score 0 points    Patient Care Team: Hilbert Bible, FNP as PCP - General (Family Medicine) Nahser, Deloris Ping, MD as PCP - Cardiology (Cardiology)     Plan:   Encounter for Medicare annual wellness exam Welcome to Medicare wellness exam completed in office. EKG is unremarkable for acute changes when compared to prior. Will obtain PSA as prostate cancer screening advised per patient's age. We discussed healthy lifestyle, preventative care, ADLs and regular exercise with goal setting for patient. Hearing and  vision also completed today. Pt to obtain 2nd Shingrix vaccine at pharmacy.  - PSA - EKG 12-Lead   I have personally reviewed and noted the following in the patient's chart:   Medical and social history Use of alcohol, tobacco or illicit drugs  Current medications and supplements Functional ability and status Nutritional status Physical activity Advanced directives List of other physicians Hospitalizations, surgeries, and ER visits in previous 12 months Vitals Screenings to include cognitive, depression, and falls Referrals and appointments  In addition, I have reviewed and discussed with patient certain preventive protocols, quality metrics, and best practice recommendations. A written personalized care  plan for preventive services as well as general preventive health recommendations were provided to patient.     Dorthy Cooler [place], [place] 07/18/2023

## 2023-07-18 NOTE — Progress Notes (Signed)
Subjective:   Marcus Harvey 1956/07/30 07/18/2023  Chief Complaint  Patient presents with   Medical Management of Chronic Issues    77-month follow up; pt states he has been doing well and denies any concerns.   Medicare Wellness    Patient is here today to have his welcome to medicare visit.    HPI: Marcus Harvey presents today for re-assessment and management of chronic medical conditions.  DIABETES MELLITUS: Marcus Harvey presents for the medical management of diabetes.  Current diabetes medication regimen: Farxiga 10mg   Patient is  adhering to a diabetic diet.  Patient is  exercising regularly.  Patient is not checking BS regularly. Patient is  checking their feet regularly.  Denies polydipsia, polyphagia, polyuria, open wounds or ulcers on feet.   A1C 04/05/2023: 7.1    04/17/2023 Lab Results  Component Value Date   LABMICR 7.1 04/17/2023   LABMICR 2.30 04/05/2023    Wt Readings from Last 3 Encounters:  07/18/23 171 lb 4.8 oz (77.7 kg)  04/26/23 179 lb (81.2 kg)  04/17/23 178 lb (80.7 kg)    HYPERLIPIDEMIA: Marcus Harvey presents for the medical management of hyperlipidemia.  Patient's current HLD regimen is: Atorvastatin 40mg  Patient is  currently taking prescribed medications for HLD.  Adhering to heathy diet: Yes, has made decrease in alcohol intake, fatty foods Exercising regularly: Yes, several times a week  Denies myalgias.  Lab Results  Component Value Date   CHOL 170 06/15/2018   HDL 62 06/15/2018   LDLCALC 89 06/15/2018   TRIG 93 06/15/2018   CHOLHDL 2.7 06/15/2018     HYPERTENSION: Marcus Harvey presents for the medical management of hypertension.  Patient's current hypertension medication regimen is: None; (stopped taking Losartan) Patient is not currently taking prescribed medications for HTN.  Patient is  regularly keeping a check on BP at home.  Adhering to low sodium diet: Yes Exercising Regularly: Working out at  [employer] several times a week  Denies headache, dizziness, CP, SHOB, vision changes.   BP Readings from Last 3 Encounters:  07/18/23 129/70  04/26/23 122/70  04/17/23 127/71     The following portions of the patient's history were reviewed and updated as appropriate: past medical history, past surgical history, family history, social history, allergies, medications, and problem list.   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 168 11/17, recheck 6 mos.    Hyperlipidemia 07/12/2017   Incomplete RBBB 07/12/2017   Primary hypertension 04/17/2023   Past Surgical History:  Procedure Laterality Date   APPENDECTOMY     SHOULDER SURGERY Left    multiple dislocations    Family History  Problem Relation Age of Onset   Cancer Brother  53       head, neck   Diabetes Mellitus I Paternal Grandfather    Outpatient Medications Prior to Visit  Medication Sig Dispense Refill   atorvastatin (LIPITOR) 40 MG tablet Take 1 tablet (40 mg total) by mouth daily. Please make overdue appt with Dr. Elease Hashimoto before anymore refills. Thank you 1st attempt 90 tablet 3    dapagliflozin propanediol (FARXIGA) 10 MG TABS tablet Take 1 tablet (10 mg total) by mouth daily before breakfast. 90 tablet 3   Continuous Glucose Sensor (FREESTYLE LIBRE 3 SENSOR) MISC Apply as directed every 14 (fourteen) days. 6 each 3   LORazepam (ATIVAN) 0.5 MG tablet Take 1 tablet (0.5 mg total) by mouth 2 (two) times daily as needed for anxiety. 10 tablet 0   losartan (COZAAR) 25 MG tablet Take 25 mg by mouth daily. (Patient not taking: Reported on 07/18/2023)     Multiple Vitamins-Minerals (MULTI FOR HIM) TABS as directed Orally     Turmeric 500 MG CAPS as directed Orally     No facility-administered medications prior to visit.   No Known Allergies   ROS: A complete ROS was performed with pertinent positives/negatives noted in the HPI. The remainder of the ROS are negative.    Objective:   Today's Vitals   07/18/23 1030  BP: 129/70  Pulse: 62  SpO2: 100%  Weight: 171 lb 4.8 oz (77.7 kg)  Height: 6\' 1"  (1.854 m)    Physical Exam          GENERAL: Well-appearing, in NAD. Well nourished.  SKIN: Pink, warm and dry. No rash, lesion, ulceration, or ecchymoses.  Head: Normocephalic. NECK: Trachea midline. Full ROM w/o pain or tenderness.  RESPIRATORY: Chest wall symmetrical. Respirations even and non-labored. Breath sounds clear to auscultation bilaterally.  CARDIAC: S1, S2 present, regular rate and rhythm without murmur or gallops. Peripheral pulses 2+ bilaterally.  MSK: Muscle tone and strength appropriate for age. Joints w/o tenderness, redness, or swelling.  EXTREMITIES: Without clubbing, cyanosis, or edema.  NEUROLOGIC: No motor or sensory deficits. Steady, even gait. C2-C12 intact.  PSYCH/MENTAL STATUS: Alert, oriented x 3. Cooperative, appropriate mood and affect.   Health Maintenance Due  Topic Date Due   Zoster Vaccines- Shingrix (2 of 2) 01/10/2020    Results for orders placed or performed in visit on 07/18/23  POCT glycosylated hemoglobin (Hb A1C)  Result  Value Ref Range   Hemoglobin A1C 6.5 (A) 4.0 - 5.6 %   HbA1c POC (<> result, manual entry)     HbA1c, POC (prediabetic range)     HbA1c, POC (controlled diabetic range) 6.5 0.0 - 7.0 %       Assessment & Plan:  1. Type 2 diabetes mellitus with hyperglycemia, without long-term current use of insulin (HCC) Well-controlled on Farxiga 10 mg daily.  Patient congratulated on weight loss, glycemic control, and healthy lifestyle.  Patient is well-motivated and will continue with current regimen.  Repeat A1c in 6 months.  Will obtain BMP today to evaluate GFR while on Farxiga.  Patient up-to-date on eye exam, foot exam and urine albumin. - POCT glycosylated hemoglobin (Hb A1C) - Basic metabolic panel  2. Primary hypertension Controlled with diet and exercise currently.  Patient to continue monitoring and congratulated on following good diet and frequent exercise.  Patient to return to PCP if blood pressure begins to become uncontrolled (over 130/90.)  3. Mixed hyperlipidemia Will obtain lipid panel with lab work today.  Patient tolerating atorvastatin 40 mg well and  will continue with current regimen. - Lipid panel  4. Encounter for Medicare annual wellness exam Welcome to Medicare wellness exam completed in office.  EKG is unremarkable when compared to prior.  Will obtain PSA as prostate cancer screening advised per patient's age.  We discussed healthy lifestyle, preventative care, ADLs and regular exercise with goal setting for patient.  Hearing and vision also completed today.  Please see attached note for Medicare wellness visit. - PSA - EKG 12-Lead  Lab Orders         Lipid panel         PSA         Basic metabolic panel         POCT glycosylated hemoglobin (Hb A1C)      Return in 6 months (on 01/15/2024) for DIABETES, HLD, HTN CHECK UP.    Patient to reach out to office if new, worrisome, or unresolved symptoms arise or if no improvement in patient's condition. Patient verbalized  understanding and is agreeable to treatment plan. All questions answered to patient's satisfaction.    Yolanda Manges, FNP

## 2023-07-19 ENCOUNTER — Encounter (HOSPITAL_BASED_OUTPATIENT_CLINIC_OR_DEPARTMENT_OTHER): Payer: Self-pay | Admitting: Family Medicine

## 2023-07-19 LAB — LIPID PANEL
Chol/HDL Ratio: 2.6 ratio (ref 0.0–5.0)
Cholesterol, Total: 148 mg/dL (ref 100–199)
HDL: 56 mg/dL (ref >39)
LDL Chol Calc (NIH): 75 mg/dL (ref 0–99)
Triglycerides: 91 mg/dL (ref 0–149)
VLDL Cholesterol Cal: 17 mg/dL (ref 5–40)

## 2023-07-19 LAB — BASIC METABOLIC PANEL
BUN/Creatinine Ratio: 15 (ref 10–24)
BUN: 14 mg/dL (ref 8–27)
CO2: 26 mmol/L (ref 20–29)
Calcium: 9.8 mg/dL (ref 8.6–10.2)
Chloride: 101 mmol/L (ref 96–106)
Creatinine, Ser: 0.94 mg/dL (ref 0.76–1.27)
Glucose: 109 mg/dL — ABNORMAL HIGH (ref 70–99)
Potassium: 4.3 mmol/L (ref 3.5–5.2)
Sodium: 141 mmol/L (ref 134–144)
eGFR: 90 mL/min/{1.73_m2} (ref >59)

## 2023-07-19 LAB — PSA: Prostate Specific Ag, Serum: 0.4 ng/mL (ref 0.0–4.0)

## 2023-07-19 NOTE — Progress Notes (Signed)
Hi Marcus Harvey,  Your kidney function and electrolytes are normal. Your cholesterol has also continued to improve. We will continue the same medications without change at this time. Keep up the good work!

## 2023-07-20 ENCOUNTER — Encounter (HOSPITAL_BASED_OUTPATIENT_CLINIC_OR_DEPARTMENT_OTHER): Payer: Self-pay | Admitting: Family Medicine

## 2023-07-31 ENCOUNTER — Ambulatory Visit: Payer: BC Managed Care – PPO | Admitting: Internal Medicine

## 2023-08-08 ENCOUNTER — Other Ambulatory Visit (HOSPITAL_BASED_OUTPATIENT_CLINIC_OR_DEPARTMENT_OTHER): Payer: Self-pay

## 2023-08-31 ENCOUNTER — Other Ambulatory Visit (HOSPITAL_BASED_OUTPATIENT_CLINIC_OR_DEPARTMENT_OTHER): Payer: Self-pay

## 2023-09-01 ENCOUNTER — Encounter (HOSPITAL_BASED_OUTPATIENT_CLINIC_OR_DEPARTMENT_OTHER): Payer: Self-pay | Admitting: Family Medicine

## 2023-09-01 ENCOUNTER — Other Ambulatory Visit (HOSPITAL_BASED_OUTPATIENT_CLINIC_OR_DEPARTMENT_OTHER): Payer: Self-pay

## 2023-09-01 NOTE — Telephone Encounter (Signed)
 Please see mychart message sent by pt and advise.

## 2023-09-19 ENCOUNTER — Encounter (HOSPITAL_BASED_OUTPATIENT_CLINIC_OR_DEPARTMENT_OTHER): Payer: Self-pay | Admitting: Family Medicine

## 2023-09-19 NOTE — Telephone Encounter (Signed)
 Please see mychart sent by pt and advise.

## 2023-09-19 NOTE — Telephone Encounter (Signed)
Pt would like referral placed

## 2023-09-21 ENCOUNTER — Other Ambulatory Visit (HOSPITAL_BASED_OUTPATIENT_CLINIC_OR_DEPARTMENT_OTHER): Payer: Self-pay

## 2023-09-29 ENCOUNTER — Encounter: Payer: Self-pay | Admitting: Cardiovascular Disease

## 2023-10-02 DIAGNOSIS — H40012 Open angle with borderline findings, low risk, left eye: Secondary | ICD-10-CM | POA: Diagnosis not present

## 2023-10-02 DIAGNOSIS — Z7984 Long term (current) use of oral hypoglycemic drugs: Secondary | ICD-10-CM | POA: Diagnosis not present

## 2023-10-02 DIAGNOSIS — E119 Type 2 diabetes mellitus without complications: Secondary | ICD-10-CM | POA: Diagnosis not present

## 2023-10-02 LAB — HM DIABETES EYE EXAM

## 2023-10-02 NOTE — Telephone Encounter (Signed)
Patient said that [facility] does not take medicare. Do you have any other suggestions he could try?

## 2023-10-09 ENCOUNTER — Encounter (HOSPITAL_BASED_OUTPATIENT_CLINIC_OR_DEPARTMENT_OTHER): Payer: Self-pay | Admitting: Family Medicine

## 2023-10-16 ENCOUNTER — Other Ambulatory Visit (HOSPITAL_BASED_OUTPATIENT_CLINIC_OR_DEPARTMENT_OTHER): Payer: Self-pay

## 2023-10-17 ENCOUNTER — Other Ambulatory Visit (HOSPITAL_BASED_OUTPATIENT_CLINIC_OR_DEPARTMENT_OTHER): Payer: Self-pay

## 2023-10-30 ENCOUNTER — Other Ambulatory Visit (HOSPITAL_BASED_OUTPATIENT_CLINIC_OR_DEPARTMENT_OTHER): Payer: Self-pay

## 2023-10-30 MED ORDER — ZOSTER VAC RECOMB ADJUVANTED 50 MCG/0.5ML IM SUSR
0.5000 mL | Freq: Once | INTRAMUSCULAR | 0 refills | Status: AC
  Filled 2023-10-30: qty 0.5, 1d supply, fill #0

## 2024-01-02 ENCOUNTER — Ambulatory Visit (INDEPENDENT_AMBULATORY_CARE_PROVIDER_SITE_OTHER): Payer: BC Managed Care – PPO | Admitting: Family Medicine

## 2024-01-02 ENCOUNTER — Encounter (HOSPITAL_BASED_OUTPATIENT_CLINIC_OR_DEPARTMENT_OTHER): Payer: Self-pay | Admitting: Family Medicine

## 2024-01-02 VITALS — BP 128/76 | HR 62 | Ht 73.0 in | Wt 180.0 lb

## 2024-01-02 DIAGNOSIS — E1165 Type 2 diabetes mellitus with hyperglycemia: Secondary | ICD-10-CM

## 2024-01-02 DIAGNOSIS — E782 Mixed hyperlipidemia: Secondary | ICD-10-CM

## 2024-01-02 DIAGNOSIS — I10 Essential (primary) hypertension: Secondary | ICD-10-CM

## 2024-01-02 LAB — LIPID PANEL
Chol/HDL Ratio: 3.1 ratio (ref 0.0–5.0)
Cholesterol, Total: 181 mg/dL (ref 100–199)
HDL: 59 mg/dL (ref >39)
LDL Chol Calc (NIH): 103 mg/dL — ABNORMAL HIGH (ref 0–99)
Triglycerides: 106 mg/dL (ref 0–149)
VLDL Cholesterol Cal: 19 mg/dL (ref 5–40)

## 2024-01-02 LAB — HEMOGLOBIN A1C
Est. average glucose Bld gHb Est-mCnc: 174 mg/dL
Hgb A1c MFr Bld: 7.7 % — ABNORMAL HIGH (ref 4.8–5.6)

## 2024-01-02 NOTE — Progress Notes (Signed)
 Subjective:   Marcus Harvey November 21, 1955 01/02/2024  Chief Complaint  Patient presents with   Medical Management of Chronic Issues    32-month follow up; states he has been doing well since last visit and denies any concerns for today.    HPI: Marcus Harvey presents today for re-assessment and management of chronic medical conditions.   HYPERTENSION: Marcus Harvey presents for the medical management of hypertension.  Patient's current hypertension medication regimen is: Diet, Exercise (rides bike 50-60 miles/week, exercising daily and monitoring diet)  Patient is  currently taking prescribed medications for HTN.  Patient is  regularly keeping a check on BP at home.  Adhering to low sodium diet: Yes Exercising Regularly: Yes Denies headache, dizziness, CP, SHOB, vision changes.    BP Readings from Last 3 Encounters:  01/02/24 128/76  07/18/23 129/70  04/26/23 122/70    HYPERLIPIDEMIA: Marcus Harvey presents for the medical management of hyperlipidemia.  Patient's current HLD regimen is: Atorvastatin  40mg  daily  Patient is  currently taking prescribed medications for HLD.  Adhering to heathy diet: Yes Exercising regularly: Yes Denies myalgias.  Lab Results  Component Value Date   CHOL 148 07/18/2023   HDL 56 07/18/2023   LDLCALC 75 07/18/2023   TRIG 91 07/18/2023   CHOLHDL 2.6 07/18/2023    DIABETES MELLITUS: Marcus Harvey presents for the medical management of diabetes.  Current diabetes medication regimen: Diet (previously on Farxiga )  Patient is  adhering to a diabetic diet.  Patient is  exercising regularly.  Patient is not checking BS regularly. Patient is  checking their feet regularly.  Denies polydipsia, polyphagia, polyuria, open wounds or ulcers on feet.  Lab Results  Component Value Date   HGBA1C 6.5 (A) 07/18/2023   HGBA1C 6.5 07/18/2023    Foot Exam: 04/17/2023 Lab Results  Component Value Date   LABMICR 7.1 04/17/2023    LABMICR 2.30 04/05/2023    Wt Readings from Last 3 Encounters:  01/02/24 180 lb (81.6 kg)  07/18/23 171 lb 4.8 oz (77.7 kg)  04/26/23 179 lb (81.2 kg)      The following portions of the patient's history were reviewed and updated as appropriate: past medical history, past surgical history, family history, social history, allergies, medications, and problem list.   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 168 11/17, recheck 6 mos.    Hyperlipidemia 07/12/2017   Incomplete RBBB 07/12/2017   Primary hypertension 04/17/2023   Past Surgical History:  Procedure Laterality Date   APPENDECTOMY     SHOULDER SURGERY Left    multiple dislocations    Family History  Problem Relation Age of Onset   Cancer Brother 80  head, neck   Diabetes Mellitus I Paternal Grandfather    Outpatient Medications Prior to Visit  Medication Sig Dispense Refill   atorvastatin  (LIPITOR) 40 MG tablet Take 1 tablet (40 mg total) by mouth daily. Please make overdue appt with Dr. Alroy Aspen before anymore refills. Thank you 1st attempt 90 tablet 3   dapagliflozin  propanediol  (FARXIGA ) 10 MG TABS tablet Take 1 tablet (10 mg total) by mouth daily before breakfast. 90 tablet 3   No facility-administered medications prior to visit.   No Known Allergies   ROS: A complete ROS was performed with pertinent positives/negatives noted in the HPI. The remainder of the ROS are negative.    Objective:   Today's Vitals   01/02/24 0838 01/02/24 0900  BP: (!) 141/83 128/76  Pulse: 62   SpO2: 100%   Weight: 180 lb (81.6 kg)   Height: 6\' 1"  (1.854 m)     Physical Exam          GENERAL: Well-appearing, in NAD. Well nourished.  SKIN: Pink, warm and dry.  Head: Normocephalic. NECK: Trachea midline. Full ROM w/o pain or tenderness.  RESPIRATORY: Chest wall symmetrical. Respirations even and non-labored. Breath sounds clear to auscultation bilaterally.  CARDIAC: S1, S2 present, regular rate and rhythm without murmur or gallops. Peripheral pulses 2+ bilaterally.  MSK: Muscle tone and strength appropriate for age.  NEUROLOGIC: No motor or sensory deficits. Steady, even gait. C2-C12 intact.  PSYCH/MENTAL STATUS: Alert, oriented x 3. Cooperative, appropriate mood and affect.   Health Maintenance Due  Topic Date Due   COVID-19 Vaccine (3 - Pfizer risk series) 02/05/2020    No results found for any visits on 01/02/24.  The 10-year ASCVD risk score (Arnett DK, et al., 2019) is: 17.9%     Assessment & Plan:  1. Type 2 diabetes mellitus with hyperglycemia, without long-term current use of insulin (HCC) (Primary) Well controlled with Diet, Exercise. Continue lifestyle management and check A1C with labs today. He is interested in CGM to monitor blood glucose and recommended looking into [employer] CGM for affordable option.  - Hemoglobin A1c  2. Mixed hyperlipidemia Well controlled previously on statin therapy. Will check Lipid Panel with labs today. Continue statin therapy as directed.  - Lipid panel  3. Primary hypertension Controlled with diet, exercise. Continue to  monitor regularly.    No orders of the defined types were placed in this encounter.  Lab Orders         Hemoglobin A1c         Lipid panel      Return in about 6 months (around 07/04/2024) for ANNUAL PHYSICAL, DIABETES CHECK UP.    Patient to reach out to office if new, worrisome, or unresolved symptoms arise or if no improvement in patient's condition. Patient verbalized understanding and is agreeable to treatment plan. All questions answered to patient's satisfaction.    Nonda Bays, [place]

## 2024-01-02 NOTE — Patient Instructions (Signed)
 [employer] [facility]GM

## 2024-01-03 ENCOUNTER — Encounter (HOSPITAL_BASED_OUTPATIENT_CLINIC_OR_DEPARTMENT_OTHER): Payer: Self-pay | Admitting: Family Medicine

## 2024-01-03 NOTE — Telephone Encounter (Signed)
 Result note was sent to pt by Trula Gable discussing the info from [employer]. Closing this encounter.

## 2024-01-03 NOTE — Progress Notes (Signed)
 Hi Marcus Harvey, Your A1c did increase up to 7.7.  It was previously at 6.5.  This is likely due to dietary changes recently as you mentioned at your appointment.  Your cholesterol did slightly increase as well, likely due to diet changes too.  Regarding the A1c, if you desired to try Farxiga  low-dose at 5 mg I be happy to send this in for you.  Otherwise we can try diet and exercise changes and recheck at your next appointment.  Please let me know which you would prefer.

## 2024-01-31 ENCOUNTER — Telehealth: Payer: Self-pay | Admitting: *Deleted

## 2024-01-31 NOTE — Telephone Encounter (Signed)
-----   Message from Othella Bliss sent at 01/31/2024  3:36 PM EDT ----- Regarding: RE: Re-assignment Rip Cheese,  I left a VM for the patient to c/b ----- Message ----- From: Peggi Bowels, LPN Sent: 09/03/1094   2:11 PM EDT To: Viviana Grooms; Shana A Stovall; Alvis Jourdain; # Subject: FW: Re-assignment                              Hey all, can you help with this?  Looks like he is recalled for August and will need to establish with Dr. Abel Hoe at that time.   Thanks, Triage ----- Message ----- From: [place] Pil[place], MD Sent: 01/31/2024   2:05 PM EDT To: [facility] Triage Subject: Re-assignment                                  Raenette Bumps is a patient of mine who will need to be reassigned. Please have him see Jere Monaco, MD.

## 2024-03-11 ENCOUNTER — Ambulatory Visit (HOSPITAL_BASED_OUTPATIENT_CLINIC_OR_DEPARTMENT_OTHER)

## 2024-03-11 ENCOUNTER — Encounter (HOSPITAL_BASED_OUTPATIENT_CLINIC_OR_DEPARTMENT_OTHER): Payer: Self-pay | Admitting: Student

## 2024-03-11 ENCOUNTER — Telehealth (HOSPITAL_BASED_OUTPATIENT_CLINIC_OR_DEPARTMENT_OTHER): Payer: Self-pay | Admitting: Cardiology

## 2024-03-11 ENCOUNTER — Ambulatory Visit (HOSPITAL_BASED_OUTPATIENT_CLINIC_OR_DEPARTMENT_OTHER): Admitting: Student

## 2024-03-11 DIAGNOSIS — M79641 Pain in right hand: Secondary | ICD-10-CM | POA: Diagnosis not present

## 2024-03-11 DIAGNOSIS — M1811 Unilateral primary osteoarthritis of first carpometacarpal joint, right hand: Secondary | ICD-10-CM | POA: Diagnosis not present

## 2024-03-11 DIAGNOSIS — M19031 Primary osteoarthritis, right wrist: Secondary | ICD-10-CM | POA: Diagnosis not present

## 2024-03-11 NOTE — Progress Notes (Signed)
 Chief Complaint: Right hand pain     History of Present Illness:    Marcus Harvey is a pleasant 68 y.o. right-hand-dominant male who presents today for evaluation of pain in his right hand.  Patient reports that during a nightmare about 5 weeks ago, he hit the lateral aspect of his hand directly on his nightstand.  Today he reports continued pain and sensitivity over this area, although he does retain good function and range of motion.  Pain is elicited when squeezed while performing a handshake.  He has tried some Aleve but denies any other significant treatments.  States that he would have hoped pain would have resolved at this point.   Surgical History:   None  PMH/PSH/Family History/Social History/Meds/Allergies:    Past Medical History:  Diagnosis Date   ADD (attention deficit disorder)    Hx of hemorrhoids    Hypercholesterolemia    high TG 1/10, 1/12, 3/13, 3/15, high LDL, TG consider statin ASCVD risk 7-5%, usual risk 3.6%, risk 6%, LDL. 168 11/17, recheck 6 mos.    Hyperlipidemia 07/12/2017   Incomplete RBBB 07/12/2017   Primary hypertension 04/17/2023   Past Surgical History:  Procedure Laterality Date   APPENDECTOMY     SHOULDER SURGERY Left    multiple dislocations    Social History   Socioeconomic History   Marital status: Married    Spouse name: Not on file   Number of children: 2   Years of education: college    Highest education level: Some college, no degree  Occupational History   Occupation: Marketing executive  Tobacco Use   Smoking status: Never   Smokeless tobacco: Never  Vaping Use   Vaping status: Never Used  Substance and Sexual Activity   Alcohol use: Yes   Drug use: No   Sexual activity: Not on file  Other Topics Concern   Not on file  Social History Narrative   Not on file   Social Drivers of Health   Financial Resource Strain: Low Risk  (01/01/2024)   Overall Financial Resource  Strain (CARDIA)    Difficulty of Paying Living Expenses: Not hard at all  Food Insecurity: No Food Insecurity (01/01/2024)   Hunger Vital Sign    Worried About Running Out of Food in the Last Year: Never true    Ran Out of Food in the Last Year: Never true  Transportation Needs: No Transportation Needs (01/01/2024)   PRAPARE - Administrator, Civil Service (Medical): No    Lack of Transportation (Non-Medical): No  Physical Activity: Sufficiently Active (01/01/2024)   Exercise Vital Sign    Days of Exercise per Week: 5 days    Minutes of Exercise per Session: 120 min  Stress: No Stress Concern Present (01/01/2024)   [employer] of Occupational Health - Occupational Stress Questionnaire    Feeling of Stress : Not at all  Social Connections: Moderately Integrated (01/01/2024)   Social Connection and Isolation Panel    Frequency of Communication with Friends and Family: More than three times a week    Frequency of Social Gatherings with Friends and Family: Three times a week    Attends Religious Services: Never    Active Member of Clubs or Organizations: No    Attends Banker Meetings: 1 to 4 times  per year    Marital Status: Married   Family History  Problem Relation Age of Onset   Cancer Brother 53       head, neck   Diabetes Mellitus I Paternal Grandfather    No Known Allergies Current Outpatient Medications  Medication Sig Dispense Refill   atorvastatin  (LIPITOR) 40 MG tablet Take 1 tablet (40 mg total) by mouth daily. Please make overdue appt with Dr. Alveta before anymore refills. Thank you 1st attempt 90 tablet 3   No current facility-administered medications for this visit.   No results found.  Review of Systems:   A ROS was performed including pertinent positives and negatives as documented in the HPI.  Physical Exam :   Constitutional: NAD and appears stated age Neurological: Alert and oriented Psych: Appropriate affect and cooperative There  were no vitals taken for this visit.   Comprehensive Musculoskeletal Exam:    Tenderness to palpation over the lateral midshaft of the fifth metacarpal on the right hand.  There is a firm, palpable protrusion over this area.  Full function is noted with 5/5 grip strength and full range of motion of all digits and wrist.  Radial pulse 2+.  Imaging:   Xray (right hand 3 views): Soft tissue swelling noted over the lateral fifth metacarpal but negative for fracture dislocation.  Advanced fourth DIP osteoarthritis.  Multiple radiolucent lesions in the wrist and first metacarpal suggestive of subchondral cysts.  Positive ulnar variance.   I personally reviewed and interpreted the radiographs.   Assessment:   68 y.o. male now 5 weeks status post injury to the lateral aspect of the right hand.  Pain is located directly over the shaft of the fifth metacarpal although x-rays today show no evidence of prior fracture.  Continued pain could be suggestive of an underlying bone bruise versus a hematoma due to some firm palpable swelling not seen on x-ray.  Recommend conservative therapies including NSAIDs, heat/ice, and activity modifications.  Should pain continue over the coming weeks, could consider further evaluation at that time.  Patient is agreeable to this plan and reassured that there is no fracture seen.  Plan :    - Return to clinic as needed     I personally saw and evaluated the patient, and participated in the management and treatment plan.  Leonce Reveal, PA-C Orthopedics

## 2024-03-11 NOTE — Telephone Encounter (Signed)
 Patient stopped by [facility] front desk to inquire if any providers are taking new patients at [facility]. He gets other care at this location and would prefer to follow at [facility] rather than establishing with Dr Verlin now that Dr Alveta has retired. Would this pt be appropriate for either Dr Lonni or Dr Raford? He is due for an appt in August.

## 2024-03-12 NOTE — Telephone Encounter (Signed)
 Hall-Turner, Leita LABOR to Me    03/12/24  2:45 PM Spoke with patient and he is going to keep the appt on 04/05/24 since there is a wait for both [employer]. He will ask [place] which provider would be better suited for his needs and request all subsequent f/u be with that provider.

## 2024-03-12 NOTE — Telephone Encounter (Signed)
 Ok to transfer to next new patient appointment with either Dr Raford or Dr Lonni

## 2024-03-12 NOTE — Telephone Encounter (Addendum)
 Hall-Turner, Leita LABOR to Me    03/12/24 11:43 AM Left a VM for patient. He is currently scheduled for 04/05/24 with Dr Verlin and first available with either Dr Lonni or Dr Raford isn't until October.  Will request patient be put on cancellation list

## 2024-04-05 ENCOUNTER — Ambulatory Visit: Attending: Cardiovascular Disease | Admitting: Cardiovascular Disease

## 2024-04-05 ENCOUNTER — Encounter: Payer: Self-pay | Admitting: Cardiovascular Disease

## 2024-04-05 VITALS — BP 138/76 | HR 68 | Ht 73.0 in | Wt 186.0 lb

## 2024-04-05 DIAGNOSIS — I2510 Atherosclerotic heart disease of native coronary artery without angina pectoris: Secondary | ICD-10-CM

## 2024-04-05 DIAGNOSIS — I7121 Aneurysm of the ascending aorta, without rupture: Secondary | ICD-10-CM

## 2024-04-05 MED ORDER — ASPIRIN 81 MG PO TBEC: 81.0000 mg | DELAYED_RELEASE_TABLET | Freq: Every day | ORAL | Status: AC

## 2024-04-05 NOTE — Progress Notes (Signed)
 Chief Complaint  Patient presents with   Coronary Artery Disease   History of Present Illness: 68 yo male with history of CAD (abnormal coronary calcium  score), HLD, HTN, thoracic aortic aneurysm, DM and incomplete RBBB who is here today for follow up. He has been followed in our office by Dr. Alveta. CT coronary calcium  score in 2018 with calcium  score of 38. He has been followed for mild dilation of the thoracic aorta. Chest CTA August 2024 with 4.4 cm ascending aorta. Echo November 2018 with LVEF=60-65%. No valve disease.   He is here today for follow up. The patient denies any chest pain, dyspnea, palpitations, lower extremity edema, orthopnea, PND, dizziness, near syncope or syncope.   Primary Care Physician: Knute Thersia Bitters, FNP   Past Medical History:  Diagnosis Date   ADD (attention deficit disorder)    Hx of hemorrhoids    Hypercholesterolemia    high TG 1/10, 1/12, 3/13, 3/15, high LDL, TG consider statin ASCVD risk 7-5%, usual risk 3.6%, risk 6%, LDL. 168 11/17, recheck 6 mos.    Hyperlipidemia 07/12/2017   Incomplete RBBB 07/12/2017   Primary hypertension 04/17/2023    Past Surgical History:  Procedure Laterality Date   APPENDECTOMY     SHOULDER SURGERY Left    multiple dislocations     Current Outpatient Medications  Medication Sig Dispense Refill   aspirin  EC 81 MG tablet Take 1 tablet (81 mg total) by mouth daily. Swallow whole.     atorvastatin  (LIPITOR) 40 MG tablet Take 1 tablet (40 mg total) by mouth daily. Please make overdue appt with Dr. Alveta before anymore refills. Thank you 1st attempt 90 tablet 3   No current facility-administered medications for this visit.    No Known Allergies  Social History   Socioeconomic History   Marital status: Married    Spouse name: Not on file   Number of children: 2   Years of education: college    Highest education level: Some college, no degree  Occupational History   Occupation: Engineer, petroleum  Tobacco Use   Smoking status: Never   Smokeless tobacco: Never  Vaping Use   Vaping status: Never Used  Substance and Sexual Activity   Alcohol use: Yes   Drug use: No   Sexual activity: Not on file  Other Topics Concern   Not on file  Social History Narrative   Not on file   Social Drivers of Health   Financial Resource Strain: Low Risk  (01/01/2024)   Overall Financial Resource Strain (CARDIA)    Difficulty of Paying Living Expenses: Not hard at all  Food Insecurity: No Food Insecurity (01/01/2024)   Hunger Vital Sign    Worried About Running Out of Food in the Last Year: Never true    Ran Out of Food in the Last Year: Never true  Transportation Needs: No Transportation Needs (01/01/2024)   PRAPARE - Administrator, Civil Service (Medical): No    Lack of Transportation (Non-Medical): No  Physical Activity: Sufficiently Active (01/01/2024)   Exercise Vital Sign    Days of Exercise per Week: 5 days    Minutes of Exercise per Session: 120 min  Stress: No Stress Concern Present (01/01/2024)   [employer] of Occupational Health - Occupational Stress Questionnaire    Feeling of Stress : Not at all  Social Connections: Moderately Integrated (01/01/2024)   Social Connection and Isolation Panel    Frequency of Communication with Friends and  Family: More than three times a week    Frequency of Social Gatherings with Friends and Family: Three times a week    Attends Religious Services: Never    Active Member of Clubs or Organizations: No    Attends Banker Meetings: 1 to 4 times per year    Marital Status: Married  Catering manager Violence: Not At Risk (07/18/2023)   Humiliation, Afraid, Rape, and Kick questionnaire    Fear of Current or Ex-Partner: No    Emotionally Abused: No    Physically Abused: No    Sexually Abused: No    Family History  Problem Relation Age of Onset   Cancer Brother 53       head, neck   Diabetes  Mellitus I Paternal Grandfather     Review of Systems:  As stated in the HPI and otherwise negative.   BP 138/76   Pulse 68   Ht 6' 1 (1.854 m)   Wt 186 lb (84.4 kg)   SpO2 98%   BMI 24.54 kg/m   Physical Examination: General: Well developed, well nourished, NAD  HEENT: OP clear, mucus membranes moist  SKIN: warm, dry. No rashes. Neuro: No focal deficits  Musculoskeletal: Muscle strength 5/5 all ext  Psychiatric: Mood and affect normal  Neck: No JVD, no carotid bruits, no thyromegaly, no lymphadenopathy.  Lungs:Clear bilaterally, no wheezes, rhonci, crackles Cardiovascular: Regular rate and rhythm. No murmurs, gallops or rubs. Abdomen:Soft. Bowel sounds present. Non-tender.  Extremities: No lower extremity edema. Pulses are 2 + in the bilateral DP/PT.  EKG:  EKG is ordered today. The ekg ordered today demonstrates  EKG Interpretation Date/Time:  Friday April 05 2024 15:16:40 EDT Ventricular Rate:  65 PR Interval:  170 QRS Duration:  92 QT Interval:  408 QTC Calculation: 424 R Axis:   -55  Text Interpretation: Normal sinus rhythm Confirmed by Verlin Bruckner [phone]) on 04/05/2024 3:29:00 PM    Recent Labs: 07/18/2023: BUN 14; Creatinine, Ser 0.94; Potassium 4.3; Sodium 141   Lipid Panel    Component Value Date/Time   CHOL 181 01/02/2024 0922   TRIG 106 01/02/2024 0922   HDL 59 01/02/2024 0922   CHOLHDL 3.1 01/02/2024 0922   LDLCALC 103 (H) 01/02/2024 0922     Wt Readings from Last 3 Encounters:  04/05/24 186 lb (84.4 kg)  01/02/24 180 lb (81.6 kg)  07/18/23 171 lb 4.8 oz (77.7 kg)    Assessment and Plan:   1. CAD without angina: CAD noted by abnormal calcium  score in 2018. He is very active. No chest pain. Continue statin. Start ASA 81 mg daily.   2. Thoracic aortic aneurysm: 4.4 cm ascending aorta by CTA August 2024. Will repeat chest CTA now.   Labs/ tests ordered today include:   Orders Placed This Encounter  Procedures   CT ANGIO CHEST  AORTA W/CM & OR WO/CM   EKG 12-Lead   Disposition:   F/U with me in one year    Signed, Bruckner Verlin, MD, [facility] 04/05/2024 4:11 PM    [facility] [address] [place], [place], [place]  [address] Phone: [phone]; Fax: [phone]

## 2024-04-05 NOTE — Patient Instructions (Signed)
 Medication Instructions:  START Low dose aspirin  (81 mg) daily  Testing/Procedures: Your provider would like you to have a chest CT angiogram of the aorta, please schedule before you leave today.   Follow-Up: At [facility], you and your health needs are our priority.  As part of our continuing mission to provide you with exceptional heart care, our providers are all part of one team.  This team includes your primary Cardiologist (physician) and Advanced Practice Providers or APPs (Physician Assistants and Nurse Practitioners) who all work together to provide you with the care you need, when you need it.  Your next appointment:   1 year(s)  Provider:   Lonni Cash, MD    We recommend signing up for the patient portal called MyChart.  Sign up information is provided on this After Visit Summary.  MyChart is used to connect with patients for Virtual Visits (Telemedicine).  Patients are able to view lab/test results, encounter notes, upcoming appointments, etc.  Non-urgent messages can be sent to your provider as well.   To learn more about what you can do with MyChart, go to [email or web address].

## 2024-04-22 ENCOUNTER — Ambulatory Visit (HOSPITAL_BASED_OUTPATIENT_CLINIC_OR_DEPARTMENT_OTHER)
Admission: RE | Admit: 2024-04-22 | Discharge: 2024-04-22 | Disposition: A | Discharge status: Home / Self Care | Source: Ambulatory Visit | Attending: Cardiovascular Disease | Admitting: Cardiovascular Disease

## 2024-04-22 DIAGNOSIS — I7121 Aneurysm of the ascending aorta, without rupture: Secondary | ICD-10-CM | POA: Insufficient documentation

## 2024-04-22 DIAGNOSIS — I2510 Atherosclerotic heart disease of native coronary artery without angina pectoris: Secondary | ICD-10-CM | POA: Diagnosis not present

## 2024-04-22 MED ORDER — IOHEXOL 350 MG/ML SOLN
100.0000 mL | Freq: Once | INTRAVENOUS | Status: AC | PRN
  Administered 2024-04-22: 100 mL via INTRAVENOUS

## 2024-04-25 ENCOUNTER — Encounter: Payer: Self-pay | Admitting: Cardiovascular Disease

## 2024-04-30 ENCOUNTER — Ambulatory Visit: Payer: Self-pay | Admitting: Cardiovascular Disease

## 2024-04-30 DIAGNOSIS — I7121 Aneurysm of the ascending aorta, without rupture: Secondary | ICD-10-CM

## 2024-05-06 ENCOUNTER — Ambulatory Visit

## 2024-05-06 VITALS — BP 138/72 | HR 71 | Resp 18 | Ht 73.0 in

## 2024-05-06 DIAGNOSIS — I7121 Aneurysm of the ascending aorta, without rupture: Secondary | ICD-10-CM | POA: Diagnosis not present

## 2024-05-06 NOTE — Patient Instructions (Signed)

## 2024-05-06 NOTE — Progress Notes (Signed)
 [address] [place] [address]             [phone]            Ulus Hazen [id number] [birth year]   History of Present Illness:  Mr. Marcus Harvey is a 68 year old male with medical history of incomplete right bundle branch block, atherosclerotic heart disease of native coronary arteries, type 2 diabetes mellitus, hyperlipidemia, ADHD, anxiety and scleroderma who presents for initial encounter for ascending thoracic aortic aneurysm.  Aneurysm was found in 2019 during a workup for an episode of atypica chest pain.  He has been followed by his cardiologist for the past 6 years with CTA of chest.  Aneurysm has slowly increased in size and now measures 4.5 cm from CTA of chest on 03/2024, which is a slight increase from 2024 where it measured 4.4 cm.   He reports that he has been doing well.  His blood pressure is slightly elevated at today's visit which he attributes to being nervous.  He does not have a history of hypertension and he does not check his BP at home.  He is very active and works out daily.  He enjoys cycling, rowing, and yard work.  He does lift but he focuses on more repetitions with lighter weights.  He denies chest pain, shortness of breath and lower leg swelling.      Current Outpatient Medications on File Prior to Visit  Medication Sig Dispense Refill   aspirin  EC 81 MG tablet Take 1 tablet (81 mg total) by mouth daily. Swallow whole.     atorvastatin  (LIPITOR) 40 MG tablet Take 1 tablet (40 mg total) by mouth daily. Please make overdue appt with Dr. Alveta before anymore refills. Thank you 1st attempt 90 tablet 3   No current facility-administered medications on file prior to visit.     ROS:  Review of Systems  Constitutional: Negative.  Negative for fever and malaise/fatigue.  Respiratory: Negative.  Negative for cough, shortness of breath and wheezing.   Cardiovascular: Negative.  Negative for chest pain and leg  swelling.  Musculoskeletal: Negative.  Negative for joint pain and myalgias.  Endo/Heme/Allergies:  Negative for polydipsia.     BP 138/72 (BP Location: Left Arm)   Pulse 71   Resp 18   Ht 6' 1 (1.854 m)   SpO2 100%   BMI 24.54 kg/m   Physical Exam Constitutional:      Appearance: Normal appearance.  HENT:     Head: Normocephalic and atraumatic.  Cardiovascular:     Rate and Rhythm: Normal rate and regular rhythm.     Heart sounds: Normal heart sounds, S1 normal and S2 normal.  Pulmonary:     Effort: Pulmonary effort is normal.     Breath sounds: Normal breath sounds.  Skin:    General: Skin is warm and dry.  Neurological:     General: No focal deficit present.     Mental Status: He is alert and oriented to person, place, and time.      Imaging: CLINICAL DATA:  Thoracic aortic aneurysm.   EXAM: CT ANGIOGRAPHY CHEST WITH CONTRAST   TECHNIQUE: Multidetector CT imaging of the chest was performed using the standard protocol during bolus administration of intravenous contrast. Multiplanar CT image reconstructions and MIPs were obtained to evaluate the vascular anatomy.   RADIATION DOSE REDUCTION: This exam was performed according to the departmental dose-optimization program which  includes automated exposure control, adjustment of the mA and/or kV according to patient size and/or use of iterative reconstruction technique.   CONTRAST:  OMNIPAQUE  IOHEXOL  350 MG/ML SOLN   COMPARISON:  04/13/2023   FINDINGS: Cardiovascular: The heart size is normal. No substantial pericardial effusion. Coronary artery calcification is evident. Ascending thoracic aorta measures 4.5 cm diameter today compared to 4.4 cm previously.   Mediastinum/Nodes: No mediastinal lymphadenopathy. There is no hilar lymphadenopathy. The esophagus has normal imaging features. There is no axillary lymphadenopathy.   Lungs/Pleura: No focal airspace consolidation. No pleural effusion. No  suspicious pulmonary nodule or mass.   Upper Abdomen: Visualized portion of the upper abdomen shows no acute findings.   Musculoskeletal: No worrisome lytic or sclerotic osseous abnormality.   Review of the MIP images confirms the above findings.   IMPRESSION: 1. Ascending thoracic aorta measures 4.5 cm today, minimally increased from 4.4 cm on the exam from 1 year ago. Ascending thoracic aortic aneurysm. Recommend semi-annual imaging followup by CTA or MRA and referral to cardiothoracic surgery if not already obtained. This recommendation follows 2010 ACCF/AHA/AATS/ACR/ASA/SCA/SCAI/SIR/STS/SVM Guidelines for the Diagnosis and Management of Patients With Thoracic Aortic Disease. Circulation. 2010; 121: [id number]. Aortic aneurysm NOS (ICD10-I71.9)     Electronically Signed   By: Camellia Candle M.D.   On: 04/29/2024 07:31     A/P: Aneurysm of ascending aorta without rupture (HCC) -4.5 cm ascending thoracic aortic aneurysm on CTA of chest.  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 We discussed the natural history and and risk factors for growth of ascending aortic aneurysms. Discussed recommendations to minimize the risk of further expansion or dissection including careful blood pressure control, avoidance of contact sports and heavy lifting, attention to lipid management.  We covered the importance of staying never user of tobacco.  The patient does not yet meet surgical criteria of >5.5cm. The patient is aware of signs and symptoms of aortic dissection and when to present to the emergency department    -Follow up in 6 months with CTA of chest   Risk Modification:  Statin:  atorvastatin    Smoking cessation instruction/counseling given:  never user  Patient was counseled on importance of Blood Pressure Control  They are instructed to contact their Primary Care Physician if they start to have blood pressure readings over 130s/90s. Do not ever stop blood pressure  medications on your own, unless instructed by healthcare professional.  Please avoid use of Fluoroquinolones as this can potentially increase your risk of Aortic Rupture and/or Dissection  Patient educated on signs and symptoms of Aortic Dissection, handout also provided in AVS  Manuelita CHRISTELLA Rough, PA-C 05/06/24]

## 2024-05-30 ENCOUNTER — Telehealth (HOSPITAL_BASED_OUTPATIENT_CLINIC_OR_DEPARTMENT_OTHER): Payer: Self-pay | Admitting: *Deleted

## 2024-05-30 ENCOUNTER — Encounter (HOSPITAL_BASED_OUTPATIENT_CLINIC_OR_DEPARTMENT_OTHER): Payer: Self-pay | Admitting: *Deleted

## 2024-05-30 NOTE — Telephone Encounter (Signed)
 LVM for patient to call the office and schedule medicare wellness visit

## 2024-06-25 ENCOUNTER — Encounter (HOSPITAL_BASED_OUTPATIENT_CLINIC_OR_DEPARTMENT_OTHER): Payer: Self-pay | Admitting: Family Medicine

## 2024-06-25 NOTE — Telephone Encounter (Signed)
 Please see mychart message sent by pt and advise.

## 2024-07-01 ENCOUNTER — Encounter: Payer: Self-pay | Admitting: Radiology

## 2024-07-05 ENCOUNTER — Other Ambulatory Visit (HOSPITAL_BASED_OUTPATIENT_CLINIC_OR_DEPARTMENT_OTHER): Payer: Self-pay

## 2024-07-05 ENCOUNTER — Other Ambulatory Visit: Payer: Self-pay

## 2024-07-05 ENCOUNTER — Ambulatory Visit (INDEPENDENT_AMBULATORY_CARE_PROVIDER_SITE_OTHER): Admitting: Family Medicine

## 2024-07-05 ENCOUNTER — Encounter (HOSPITAL_BASED_OUTPATIENT_CLINIC_OR_DEPARTMENT_OTHER): Payer: Self-pay | Admitting: Family Medicine

## 2024-07-05 VITALS — BP 130/80 | HR 64 | Ht 72.0 in | Wt 183.0 lb

## 2024-07-05 DIAGNOSIS — I7300 Raynaud's syndrome without gangrene: Secondary | ICD-10-CM

## 2024-07-05 DIAGNOSIS — E1165 Type 2 diabetes mellitus with hyperglycemia: Secondary | ICD-10-CM | POA: Diagnosis not present

## 2024-07-05 DIAGNOSIS — Z125 Encounter for screening for malignant neoplasm of prostate: Secondary | ICD-10-CM | POA: Diagnosis not present

## 2024-07-05 DIAGNOSIS — Z0000 Encounter for general adult medical examination without abnormal findings: Secondary | ICD-10-CM | POA: Diagnosis not present

## 2024-07-05 DIAGNOSIS — E782 Mixed hyperlipidemia: Secondary | ICD-10-CM | POA: Diagnosis not present

## 2024-07-05 DIAGNOSIS — I7121 Aneurysm of the ascending aorta, without rupture: Secondary | ICD-10-CM

## 2024-07-05 MED ORDER — AMLODIPINE BESYLATE 2.5 MG PO TABS
2.5000 mg | ORAL_TABLET | Freq: Every day | ORAL | 2 refills | Status: AC
  Filled 2024-07-05 (×2): qty 60, 60d supply, fill #0

## 2024-07-05 NOTE — Progress Notes (Signed)
 Subjective:   Cecile Guevara Apr 25, 1956 07/05/2024  CC:  Chief Complaint  Patient presents with   Annual Exam    Patient in room # 10 and alone. Pt states he been having some sleep issues and he believes he been eating ae poor Diet.    HPI: Juanya Villavicencio is a 68 y.o. male who presents for a routine health maintenance exam.  Labs collected at time of visit.   HEALTH SCREENINGS: - Vision Screening: up to date - Dental Visits: up to date - Testicular Exam: Declined - STD Screening: Declined - PSA (50+): Ordered today   Lab Results  Component Value Date   PSA1 0.4 07/18/2023     - Colonoscopy (45+): Up to date  Discussed with patient purpose of the colonoscopy is to detect colon cancer at curable precancerous or early stages  - AAA Screening: Up to date  Men age 82-75 who have ever smoked - Lung Cancer screening with low-dose CT: Not applicable-  Adults age 18-80 who are current cigarette smokers or quit within the last 15 years. Must have 20 pack year history.   Depression and Anxiety Screen done today and results listed below:     01/02/2024    8:41 AM 07/18/2023   10:32 AM 04/17/2023   10:45 AM  Depression screen PHQ 2/9  Decreased Interest 0 0 0  Down, Depressed, Hopeless 0 0 0  PHQ - 2 Score 0 0 0  Altered sleeping 0 0   Tired, decreased energy 0 0   Change in appetite 0 0   Feeling bad or failure about yourself  0 0   Trouble concentrating 0 0   Moving slowly or fidgety/restless 0 0   Suicidal thoughts 0 0   PHQ-9 Score 0  0    Difficult doing work/chores Not difficult at all Not difficult at all      Data saved with a previous flowsheet row definition      01/02/2024    8:41 AM 07/18/2023   10:32 AM  GAD 7 : Generalized Anxiety Score  Nervous, Anxious, on Edge 0 0  Control/stop worrying 0 0  Worry too much - different things 0 0  Trouble relaxing 0 0  Restless 0 0  Easily annoyed or irritable 0 0  Afraid - awful might happen 0 0  Total GAD 7  Score 0 0  Anxiety Difficulty Not difficult at all Not difficult at all    IMMUNIZATIONS:  - Tdap: Tetanus vaccination status reviewed: last tetanus booster within 10 years. - Influenza: Refused - Pneumovax: Up to date - Prevnar: UTD - Shingrix  vaccine (50+): Up to date   Past medical history, surgical history, medications, allergies, family history and social history reviewed with patient today and changes made to appropriate areas of the chart.   Past Medical History:  Diagnosis Date   ADD (attention deficit disorder)    Hx of hemorrhoids    Hypercholesterolemia    high TG 1/10, 1/12, 3/13, 3/15, high LDL, TG consider statin ASCVD risk 7-5%, usual risk 3.6%, risk 6%, LDL. 168 11/17, recheck 6 mos.    Hyperlipidemia 07/12/2017   Incomplete RBBB 07/12/2017   Primary hypertension 04/17/2023    Past Surgical History:  Procedure Laterality Date   APPENDECTOMY     SHOULDER SURGERY Left    multiple dislocations     Current Outpatient Medications on File Prior to Visit  Medication Sig   aspirin  EC 81 MG tablet Take 1 tablet (81  mg total) by mouth daily. Swallow whole.   atorvastatin  (LIPITOR) 40 MG tablet Take 1 tablet (40 mg total) by mouth daily. Please make overdue appt with Dr. Alveta before anymore refills. Thank you 1st attempt   No current facility-administered medications on file prior to visit.    Allergies  Allergen Reactions   Quinolones Other (See Comments)    Ascending thoracic aortic aneurysm     Social History   Socioeconomic History   Marital status: Married    Spouse name: Not on file   Number of children: 2   Years of education: college    Highest education level: Associate degree: occupational, scientist, product/process development, or vocational program  Occupational History   Occupation: marketing executive  Tobacco Use   Smoking status: Never   Smokeless tobacco: Never  Vaping Use   Vaping status: Never Used  Substance and Sexual Activity   Alcohol use:  Yes   Drug use: No   Sexual activity: Not on file  Other Topics Concern   Not on file  Social History Narrative   Not on file   Social Drivers of Health   Financial Resource Strain: Low Risk  (07/02/2024)   Overall Financial Resource Strain (CARDIA)    Difficulty of Paying Living Expenses: Not hard at all  Food Insecurity: No Food Insecurity (07/02/2024)   Hunger Vital Sign    Worried About Running Out of Food in the Last Year: Never true    Ran Out of Food in the Last Year: Never true  Transportation Needs: No Transportation Needs (07/02/2024)   PRAPARE - Administrator, Civil Service (Medical): No    Lack of Transportation (Non-Medical): No  Physical Activity: Sufficiently Active (07/02/2024)   Exercise Vital Sign    Days of Exercise per Week: 6 days    Minutes of Exercise per Session: 80 min  Stress: No Stress Concern Present (07/02/2024)   [employer] of Occupational Health - Occupational Stress Questionnaire    Feeling of Stress: Not at all  Social Connections: Moderately Integrated (07/02/2024)   Social Connection and Isolation Panel    Frequency of Communication with Friends and Family: More than three times a week    Frequency of Social Gatherings with Friends and Family: More than three times a week    Attends Religious Services: More than 4 times per year    Active Member of [employer] or Organizations: No    Attends Engineer, Structural: Not on file    Marital Status: Married  Catering Manager Violence: Not At Risk (07/18/2023)   Humiliation, Afraid, Rape, and Kick questionnaire    Fear of Current or Ex-Partner: No    Emotionally Abused: No    Physically Abused: No    Sexually Abused: No   Social History   Tobacco Use  Smoking Status Never  Smokeless Tobacco Never   Social History   Substance and Sexual Activity  Alcohol Use Yes     Family History  Problem Relation Age of Onset   Cancer Brother 53       head, neck   Diabetes  Mellitus I Paternal Grandfather      ROS: Denies fever, fatigue, unexplained weight loss/gain, CP, SHOB, and palpatitations. Denies neurological deficits, gastrointestinal and/or genitourinary complaints, and skin changes.   Objective:   Today's Vitals   07/05/24 0818  BP: 139/80  Pulse: 64  SpO2: 97%  Weight: 183 lb (83 kg)  Height: 6' (1.829 m)    GENERAL  APPEARANCE: Well-appearing, in NAD. Well nourished.  SKIN: Pink, warm and dry. Turgor normal. No rash, lesion, ulceration, or ecchymoses. Hair evenly distributed.  HEENT: HEAD: Normocephalic.  EYES: PERRLA. EOMI. Lids intact w/o defect. Sclera white, Conjunctiva pink w/o exudate.  EARS: External ear w/o redness, swelling, masses or lesions. EAC clear. TM's intact, translucent w/o bulging, appropriate landmarks visualized. Appropriate acuity to conversational tones.  NOSE: Septum midline w/o deformity. Nares patent, mucosa pink and non-inflamed w/o drainage. No sinus tenderness.  THROAT: Uvula midline. Oropharynx clear. Tonsils non-inflamed w/o exudate. Oral mucosa pink and moist.  NECK: Supple, Trachea midline. Full ROM w/o pain or tenderness. No lymphadenopathy. Thyroid  non-tender w/o enlargement or palpable masses.  RESPIRATORY: Chest wall symmetrical w/o masses. Respirations even and non-labored. Breath sounds clear to auscultation bilaterally. No wheezes, rales, rhonchi, or crackles. CARDIAC: S1, S2 present, regular rate and rhythm. No gallops, murmurs, rubs, or clicks. PMI w/o lifts, heaves, or thrills. No carotid bruits. Capillary refill <2 seconds. Peripheral pulses 2+ bilaterally. GI: Abdomen soft w/o distention. Normoactive bowel sounds. No palpable masses or tenderness. No guarding or rebound tenderness. Liver and spleen w/o tenderness or enlargement. No CVA tenderness.  GU: Pt deferred exam. MSK: Muscle tone and strength appropriate for age, w/o atrophy or abnormal movement. EXTREMITIES: Active ROM intact, w/o tenderness,  crepitus, or contracture. No obvious joint deformities or effusions. No clubbing, edema. Mildly decreased cap refill and discoloration with redness to all toes of both feet due to Raynaud's syndrome.   NEUROLOGIC: CN's II-XII intact. Motor strength symmetrical with no obvious weakness. No sensory deficits. DTR 2+ symmetric bilaterally. Steady, even gait.  PSYCH/MENTAL STATUS: Alert, oriented x 3. Cooperative, appropriate mood and affect.     Assessment & Plan:  1. Annual physical exam (Primary) Discussed preventative screenings, vaccines, and healthy lifestyle with patient. HE will return to complete fasting labs within 1 week.   - PSA; Future - TSH; Future - Comprehensive metabolic panel with GFR; Future - CBC with Differential/Platelet; Future  2. Type 2 diabetes mellitus with hyperglycemia, without long-term current use of insulin (HCC) A1C previously above 7.0 in the past 6 months. Patient desired to improve with diet, exercise. He is active and exerises daily with good diet. Will cut back on beer intake per patient. Will complete labs and urine albumin. Foot exam complete without neuropathy present.  - Urine Microalbumin w/creat. ratio; Future - Comprehensive metabolic panel with GFR; Future - Hemoglobin A1c; Future  3. Mixed hyperlipidemia Will obtain fasting LP with labs and titrate statin therapy if needed.  - Lipid panel; Future  4. Raynaud's phenomenon without gangrene Currently uncontrolled with decreased temperature and coloration to bilateral feet at toes. Will trial Amlodipine 2.5mg  with close follow of BP. He will have BP check in 1 week with lab. Discussed rewarming measures with patient and education.  - amLODipine (NORVASC) 2.5 MG tablet; Take 1 tablet (2.5 mg total) by mouth daily.  Dispense: 60 tablet; Refill: 2  5. Prostate cancer screening - PSA; Future  6. Aneurysm of ascending aorta without rupture Visit in Sept 2025 reviewed by PCP. Patient will schedule f/u  in march 2026 for CTA for bi-annual surveillance given slight growth.   Orders Placed This Encounter  Procedures   Urine Microalbumin w/creat. ratio    Standing Status:   Future    Expiration Date:   07/05/2025   Lipid panel    Standing Status:   Future    Expiration Date:   07/05/2025   PSA  Standing Status:   Future    Expiration Date:   07/05/2025   TSH    Standing Status:   Future    Expiration Date:   07/05/2025   Comprehensive metabolic panel with GFR    Standing Status:   Future    Expiration Date:   07/05/2025   CBC with Differential/Platelet    Standing Status:   Future    Expiration Date:   07/05/2025   Hemoglobin A1c    Standing Status:   Future    Expiration Date:   07/05/2025    PATIENT COUNSELING: - Encouraged to adjust caloric intake to maintain or achieve ideal body weight, to reduce intake of dietary saturated fat and total fat, to limit sodium intake by avoiding high sodium foods and not adding table salt, and to maintain adequate dietary potassium and calcium  preferably from fresh fruits, vegetables, and low-fat dairy products.   - Advised to avoid cigarette smoking. - Discussed with the patient that most people either abstain from alcohol or drink within safe limits (<=14/week and <=4 drinks/occasion for males, <=7/weeks and <= 3 drinks/occasion for females) and that the risk for alcohol disorders and other health effects rises proportionally with the number of drinks per week and how often a drinker exceeds daily limits. - Discussed cessation/primary prevention of drug use and availability of treatment for abuse.   - Stressed the importance of regular exercise - Injury prevention: Discussed safety belts, safety helmets, smoke detector, smoking near bedding or upholstery.  - Dental health: Discussed importance of regular tooth brushing, flossing, and dental visits.  - Sexuality: Discussed sexually transmitted diseases, partner selection, use of condoms, avoidance of  unintended pregnancy  and contraceptive alternatives.   NEXT PREVENTATIVE PHYSICAL DUE IN 1 YEAR.  Return in about 6 months (around 01/02/2025) for DIABETES CHECK UP, f/u Raynauds.  Patient to reach out to office if new, worrisome, or unresolved symptoms arise or if no improvement in patient's condition. Patient verbalized understanding and is agreeable to treatment plan. All questions answered to patient's satisfaction.    Thersia Schuyler Stark, [place]

## 2024-07-05 NOTE — Patient Instructions (Signed)
 Compression Stockings/Socks  15-11mmHg   Monitor your Blood pressure at least once weekly  Take Amlodipine 2.5mg  at nighttime.   Schedule your follow up 6 months with [facility] (March 2026)

## 2024-07-12 ENCOUNTER — Encounter (HOSPITAL_BASED_OUTPATIENT_CLINIC_OR_DEPARTMENT_OTHER): Payer: Self-pay | Admitting: Family Medicine

## 2024-07-12 ENCOUNTER — Ambulatory Visit (INDEPENDENT_AMBULATORY_CARE_PROVIDER_SITE_OTHER): Admitting: *Deleted

## 2024-07-12 DIAGNOSIS — E1165 Type 2 diabetes mellitus with hyperglycemia: Secondary | ICD-10-CM

## 2024-07-12 DIAGNOSIS — Z0000 Encounter for general adult medical examination without abnormal findings: Secondary | ICD-10-CM

## 2024-07-12 DIAGNOSIS — Z125 Encounter for screening for malignant neoplasm of prostate: Secondary | ICD-10-CM

## 2024-07-12 DIAGNOSIS — E782 Mixed hyperlipidemia: Secondary | ICD-10-CM | POA: Diagnosis not present

## 2024-07-12 NOTE — Progress Notes (Signed)
 Pt came in today for repeat BP and labwork. BP checked twice and both readings have been documented.  Pt stated that he has been drinking coffee this morning.

## 2024-07-13 LAB — CBC WITH DIFFERENTIAL/PLATELET
Basophils Absolute: 0.1 x10E3/uL (ref 0.0–0.2)
Basos: 1 %
EOS (ABSOLUTE): 0.1 x10E3/uL (ref 0.0–0.4)
Eos: 2 %
Hematocrit: 50 % (ref 37.5–51.0)
Hemoglobin: 16.8 g/dL (ref 13.0–17.7)
Immature Grans (Abs): 0 x10E3/uL (ref 0.0–0.1)
Immature Granulocytes: 0 %
Lymphocytes Absolute: 1.8 x10E3/uL (ref 0.7–3.1)
Lymphs: 35 %
MCH: 34.4 pg — ABNORMAL HIGH (ref 26.6–33.0)
MCHC: 33.6 g/dL (ref 31.5–35.7)
MCV: 102 fL — ABNORMAL HIGH (ref 79–97)
Monocytes Absolute: 0.3 x10E3/uL (ref 0.1–0.9)
Monocytes: 6 %
Neutrophils Absolute: 2.9 x10E3/uL (ref 1.4–7.0)
Neutrophils: 56 %
Platelets: 253 x10E3/uL (ref 150–450)
RBC: 4.89 x10E6/uL (ref 4.14–5.80)
RDW: 12.4 % (ref 11.6–15.4)
WBC: 5.2 x10E3/uL (ref 3.4–10.8)

## 2024-07-13 LAB — LIPID PANEL
Chol/HDL Ratio: 2.9 ratio (ref 0.0–5.0)
Cholesterol, Total: 176 mg/dL (ref 100–199)
HDL: 61 mg/dL (ref >39)
LDL Chol Calc (NIH): 88 mg/dL (ref 0–99)
Triglycerides: 158 mg/dL — ABNORMAL HIGH (ref 0–149)
VLDL Cholesterol Cal: 27 mg/dL (ref 5–40)

## 2024-07-13 LAB — COMPREHENSIVE METABOLIC PANEL WITH GFR
ALT: 25 IU/L (ref 0–44)
AST: 33 IU/L (ref 0–40)
Albumin: 4.9 g/dL (ref 3.9–4.9)
Alkaline Phosphatase: 77 IU/L (ref 47–123)
BUN/Creatinine Ratio: 13 (ref 10–24)
BUN: 13 mg/dL (ref 8–27)
Bilirubin Total: 0.7 mg/dL (ref 0.0–1.2)
CO2: 25 mmol/L (ref 20–29)
Calcium: 9.5 mg/dL (ref 8.6–10.2)
Chloride: 101 mmol/L (ref 96–106)
Creatinine, Ser: 1.02 mg/dL (ref 0.76–1.27)
Globulin, Total: 2.7 g/dL (ref 1.5–4.5)
Glucose: 179 mg/dL — ABNORMAL HIGH (ref 70–99)
Potassium: 4.6 mmol/L (ref 3.5–5.2)
Sodium: 141 mmol/L (ref 134–144)
Total Protein: 7.6 g/dL (ref 6.0–8.5)
eGFR: 81 mL/min/1.73 (ref >59)

## 2024-07-13 LAB — MICROALBUMIN / CREATININE URINE RATIO
Creatinine, Urine: 84.4 mg/dL
Microalb/Creat Ratio: 47 mg/g{creat} — ABNORMAL HIGH (ref 0–29)
Microalbumin, Urine: 39.5 ug/mL

## 2024-07-13 LAB — TSH: TSH: 1.18 u[IU]/mL (ref 0.450–4.500)

## 2024-07-13 LAB — HEMOGLOBIN A1C
Est. average glucose Bld gHb Est-mCnc: 163 mg/dL
Hgb A1c MFr Bld: 7.3 % — ABNORMAL HIGH (ref 4.8–5.6)

## 2024-07-13 LAB — PSA: Prostate Specific Ag, Serum: 0.3 ng/mL (ref 0.0–4.0)

## 2024-07-14 ENCOUNTER — Ambulatory Visit (HOSPITAL_BASED_OUTPATIENT_CLINIC_OR_DEPARTMENT_OTHER): Payer: Self-pay | Admitting: Family Medicine

## 2024-07-14 DIAGNOSIS — E1129 Type 2 diabetes mellitus with other diabetic kidney complication: Secondary | ICD-10-CM

## 2024-07-14 NOTE — Progress Notes (Signed)
 Hi Marcus Harvey,  Your A1C has improved from 7.7 down to 7.3. The goal is to be less than 7.0. Your urine also showed some protein present likely due to diabetes. I would recommend possibly starting medication such as Jardiance or Farxiga  if you are interested, We can continue to monitor with diet and exercise as well. Please increase your clear fluid intake. Blood counts are stable. Kidney and liver function is stable. Your triglycerides were up from prior. Continue to lower your carbohydrate and sugar intake. PSA and thyroid  function is normal.

## 2024-08-06 ENCOUNTER — Telehealth

## 2024-08-06 ENCOUNTER — Other Ambulatory Visit: Payer: Self-pay

## 2024-08-06 ENCOUNTER — Encounter (HOSPITAL_BASED_OUTPATIENT_CLINIC_OR_DEPARTMENT_OTHER): Payer: Self-pay | Admitting: Family Medicine

## 2024-08-06 DIAGNOSIS — J069 Acute upper respiratory infection, unspecified: Secondary | ICD-10-CM | POA: Diagnosis not present

## 2024-08-06 NOTE — Telephone Encounter (Signed)
 Please see mychart message sent by pt and advise.

## 2024-08-07 ENCOUNTER — Other Ambulatory Visit (HOSPITAL_BASED_OUTPATIENT_CLINIC_OR_DEPARTMENT_OTHER): Payer: Self-pay | Admitting: Family Medicine

## 2024-08-07 ENCOUNTER — Other Ambulatory Visit (HOSPITAL_BASED_OUTPATIENT_CLINIC_OR_DEPARTMENT_OTHER): Payer: Self-pay

## 2024-08-07 ENCOUNTER — Encounter (HOSPITAL_BASED_OUTPATIENT_CLINIC_OR_DEPARTMENT_OTHER): Payer: Self-pay

## 2024-08-07 ENCOUNTER — Other Ambulatory Visit: Payer: Self-pay

## 2024-08-07 ENCOUNTER — Encounter: Payer: Self-pay | Admitting: Cardiovascular Disease

## 2024-08-07 MED ORDER — ATORVASTATIN CALCIUM 40 MG PO TABS
40.0000 mg | ORAL_TABLET | Freq: Every day | ORAL | 1 refills | Status: AC
  Filled 2024-08-07: qty 90, 90d supply, fill #0

## 2024-08-07 MED ORDER — PROMETHAZINE-DM 6.25-15 MG/5ML PO SYRP
5.0000 mL | ORAL_SOLUTION | Freq: Four times a day (QID) | ORAL | 0 refills | Status: AC | PRN
  Filled 2024-08-07: qty 118, 6d supply, fill #0

## 2024-08-07 NOTE — Telephone Encounter (Signed)
 Replied to in other encounter, read by patient

## 2024-08-07 NOTE — Progress Notes (Deleted)
 We are sorry that you are not feeling well.  Here is how we plan to help!  Based on your presentation I believe you most likely have A cough due to a virus.  This is called viral bronchitis and is best treated by rest, plenty of fluids and control of the cough.  You may use Ibuprofen or Tylenol as directed to help your symptoms.     In addition you may use {Evisit cough meds 2:21012031}  {EVISITCOUGHPREDNISONE:140100125}  From your responses in the eVisit questionnaire you describe inflammation in the upper respiratory tract which is causing a significant cough.  This is commonly called Bronchitis and has four common causes:   Allergies Viral Infections Acid Reflux Bacterial Infection Allergies, viruses and acid reflux are treated by controlling symptoms or eliminating the cause. An example might be a cough caused by taking certain blood pressure medications. You stop the cough by changing the medication. Another example might be a cough caused by acid reflux. Controlling the reflux helps control the cough.     HOME CARE Only take medications as instructed by your medical team. Complete the entire course of an antibiotic. Drink plenty of fluids and get plenty of rest. Avoid close contacts especially the very young and the elderly Cover your mouth if you cough or cough into your sleeve. Always remember to wash your hands A steam or ultrasonic humidifier can help congestion.   GET HELP RIGHT AWAY IF: You develop worsening fever. You become short of breath You cough up blood. Your symptoms persist after you have completed your treatment plan MAKE SURE YOU  Understand these instructions. Will watch your condition. Will get help right away if you are not doing well or get worse.  Your e-visit answers were reviewed by a board certified advanced clinical practitioner to complete your personal care plan.  Depending on the condition, your plan could have included both over the counter or  prescription medications. If there is a problem please reply  once you have received a response from your provider. Your safety is important to us .  If you have drug allergies check your prescription carefully.    You can use MyChart to ask questions about todays visit, request a non-urgent call back, or ask for a work or school excuse for 24 hours related to this e-Visit. If it has been greater than 24 hours you will need to follow up with your provider, or enter a new e-Visit to address those concerns. You will get an e-mail in the next two days asking about your experience.  I hope that your e-visit has been valuable and will speed your recovery. Thank you for using e-visits.   I have spent 5 minutes in review of e-visit questionnaire, review and updating patient chart, medical decision making and response to patient.   Chiquita CHRISTELLA Barefoot, NP

## 2024-08-07 NOTE — Progress Notes (Signed)
 We are sorry that you are not feeling well.  Here is how we plan to help!  Based on your presentation I believe you most likely have A cough due to a virus.  This is called viral bronchitis and is best treated by rest, plenty of fluids and control of the cough.  You may use Ibuprofen or Tylenol as directed to help your symptoms.  I am glad that the symptoms are starting to improve for you. I would recommend to continue what you are doing at home.    In addition you may use a Prescription cough syrup called Promethazine  DM. Take 5mL every 6 hours as needed for cough and congestion.   From your responses in the eVisit questionnaire you describe inflammation in the upper respiratory tract which is causing a significant cough.  This is commonly called Bronchitis and has four common causes:   Allergies Viral Infections Acid Reflux Bacterial Infection Allergies, viruses and acid reflux are treated by controlling symptoms or eliminating the cause. An example might be a cough caused by taking certain blood pressure medications. You stop the cough by changing the medication. Another example might be a cough caused by acid reflux. Controlling the reflux helps control the cough.     HOME CARE Only take medications as instructed by your medical team. Complete the entire course of an antibiotic. Drink plenty of fluids and get plenty of rest. Avoid close contacts especially the very young and the elderly Cover your mouth if you cough or cough into your sleeve. Always remember to wash your hands A steam or ultrasonic humidifier can help congestion.   GET HELP RIGHT AWAY IF: You develop worsening fever. You become short of breath You cough up blood. Your symptoms persist after you have completed your treatment plan MAKE SURE YOU  Understand these instructions. Will watch your condition. Will get help right away if you are not doing well or get worse.  Your e-visit answers were reviewed by a board  certified advanced clinical practitioner to complete your personal care plan.  Depending on the condition, your plan could have included both over the counter or prescription medications. If there is a problem please reply  once you have received a response from your provider. Your safety is important to us .  If you have drug allergies check your prescription carefully.    You can use MyChart to ask questions about todays visit, request a non-urgent call back, or ask for a work or school excuse for 24 hours related to this e-Visit. If it has been greater than 24 hours you will need to follow up with your provider, or enter a new e-Visit to address those concerns. You will get an e-mail in the next two days asking about your experience.  I hope that your e-visit has been valuable and will speed your recovery. Thank you for using e-visits.   I have spent 5 minutes in review of e-visit questionnaire, review and updating patient chart, medical decision making and response to patient.   Delon CHRISTELLA Dickinson, PA-C

## 2024-08-19 ENCOUNTER — Ambulatory Visit (HOSPITAL_BASED_OUTPATIENT_CLINIC_OR_DEPARTMENT_OTHER): Admitting: Cardiovascular Disease

## 2024-09-03 ENCOUNTER — Ambulatory Visit (HOSPITAL_BASED_OUTPATIENT_CLINIC_OR_DEPARTMENT_OTHER)

## 2024-09-03 ENCOUNTER — Ambulatory Visit (INDEPENDENT_AMBULATORY_CARE_PROVIDER_SITE_OTHER)

## 2024-09-03 ENCOUNTER — Encounter (HOSPITAL_BASED_OUTPATIENT_CLINIC_OR_DEPARTMENT_OTHER): Payer: Self-pay

## 2024-09-03 VITALS — BP 141/75 | Ht 72.0 in | Wt 183.0 lb

## 2024-09-03 DIAGNOSIS — Z0000 Encounter for general adult medical examination without abnormal findings: Secondary | ICD-10-CM | POA: Diagnosis not present

## 2024-09-03 NOTE — Progress Notes (Signed)
 "  Chief Complaint  Patient presents with   Medicare Wellness     Subjective:   Marcus Harvey is a 69 y.o. male who presents for a Medicare Annual Wellness Visit.  Visit info / Clinical Intake: Medicare Wellness Visit Type:: Initial Annual Wellness Visit Persons participating in visit and providing information:: patient Medicare Wellness Visit Mode:: Telephone If telephone:: video declined Since this visit was completed virtually, some vitals may be partially provided or unavailable. Missing vitals are due to the limitations of the virtual format.: Documented vitals are patient reported If Telephone or Video please confirm:: I connected with patient using audio/video enable telemedicine. I verified patient identity with two identifiers, discussed telehealth limitations, and patient agreed to proceed. Patient Location:: Home (Home) Provider Location:: Office Interpreter Needed?: No Pre-visit prep was completed: yes AWV questionnaire completed by patient prior to visit?: yes Date:: 08/30/24 Living arrangements:: (Patient-Rptd) lives with spouse/significant other Patient's Overall Health Status Rating: (Patient-Rptd) very good Typical amount of pain: (Patient-Rptd) none Does pain affect daily life?: (Patient-Rptd) no Are you currently prescribed opioids?: no  Dietary Habits and Nutritional Risks How many meals a day?: (Patient-Rptd) 3 Eats fruit and vegetables daily?: (Patient-Rptd) yes Most meals are obtained by: (Patient-Rptd) preparing own meals Diabetic:: no  Functional Status Activities of Daily Living (to include ambulation/medication): (Patient-Rptd) Independent Ambulation: (Patient-Rptd) Independent Medication Administration: (Patient-Rptd) Independent Home Management (perform basic housework or laundry): (Patient-Rptd) Independent Manage your own finances?: (Patient-Rptd) yes Primary transportation is: (Patient-Rptd) driving Concerns about vision?: no *vision  screening is required for WTM* Concerns about hearing?: no  Fall Screening Falls in the past year?: (Patient-Rptd) 0 Number of falls in past year: 0 Was there an injury with Fall?: 0 Fall Risk Category Calculator: 0 Patient Fall Risk Level: Low Fall Risk  Fall Risk Patient at Risk for Falls Due to: No Fall Risks Fall risk Follow up: Falls prevention discussed; Falls evaluation completed  Home and Transportation Safety: All rugs have non-skid backing?: (Patient-Rptd) yes All stairs or steps have railings?: (Patient-Rptd) yes Grab bars in the bathtub or shower?: (!) (Patient-Rptd) no Have non-skid surface in bathtub or shower?: (Patient-Rptd) yes Good home lighting?: (Patient-Rptd) yes Regular seat belt use?: (Patient-Rptd) yes Hospital stays in the last year:: (Patient-Rptd) no  Cognitive Assessment Difficulty concentrating, remembering, or making decisions? : (Patient-Rptd) no Will 6CIT or Mini Cog be Completed: no 6CIT or Mini Cog Declined: patient alert, oriented, able to answer questions appropriately and recall recent events  Advance Directives (For Healthcare) Does Patient Have a Medical Advance Directive?: Yes Type of Advance Directive: Healthcare Power of [place]; Living will; Out of facility DNR (pink MOST or yellow form) Copy of Healthcare Power of Attorney in Chart?: No - copy requested Copy of Living Will in Chart?: No - copy requested Out of facility DNR (pink MOST or yellow form) in Chart? (Ambulatory ONLY): No - copy requested  Reviewed/Updated  Reviewed/Updated: Reviewed All (Medical, Surgical, Family, Medications, Allergies, Care Teams, Patient Goals)    Allergies (verified) Quinolones   Current Medications (verified) Outpatient Encounter Medications as of 09/03/2024  Medication Sig   amLODipine  (NORVASC ) 2.5 MG tablet Take 1 tablet (2.5 mg total) by mouth daily. (Patient taking differently: Take 2.5 mg by mouth daily. 3-4 times a week)   aspirin  EC 81 MG  tablet Take 1 tablet (81 mg total) by mouth daily. Swallow whole.   atorvastatin  (LIPITOR) 40 MG tablet Take 1 tablet (40 mg total) by mouth daily.   promethazine -dextromethorphan (PROMETHAZINE -DM) 6.25-15 MG/5ML syrup  Take 5 mLs by mouth 4 (four) times daily as needed.   No facility-administered encounter medications on file as of 09/03/2024.    History: Past Medical History:  Diagnosis Date   ADD (attention deficit disorder)    Hx of hemorrhoids    Hypercholesterolemia    high TG 1/10, 1/12, 3/13, 3/15, high LDL, TG consider statin ASCVD risk 7-5%, usual risk 3.6%, risk 6%, LDL. 168 11/17, recheck 6 mos.    Hyperlipidemia 07/12/2017   Incomplete RBBB 07/12/2017   Primary hypertension 04/17/2023   Past Surgical History:  Procedure Laterality Date   APPENDECTOMY     SHOULDER SURGERY Left    multiple dislocations    Family History  Problem Relation Age of Onset   Cancer Brother 39       head, neck   Diabetes Mellitus I Paternal Grandfather    Social History   Occupational History   Occupation: marketing executive  Tobacco Use   Smoking status: Never   Smokeless tobacco: Never  Vaping Use   Vaping status: Never Used  Substance and Sexual Activity   Alcohol use: Yes   Drug use: No   Sexual activity: Not on file   Tobacco Counseling Counseling given: Not Answered  SDOH Screenings   Food Insecurity: No Food Insecurity (09/03/2024)  Housing: Low Risk (09/03/2024)  Transportation Needs: No Transportation Needs (09/03/2024)  Utilities: Not At Risk (09/03/2024)  Alcohol Screen: Low Risk (07/02/2024)  Depression (PHQ2-9): Low Risk (09/03/2024)  Financial Resource Strain: Low Risk (07/02/2024)  Physical Activity: Sufficiently Active (09/03/2024)  Social Connections: Moderately Integrated (09/03/2024)  Stress: No Stress Concern Present (09/03/2024)  Tobacco Use: Low Risk (09/03/2024)  Health Literacy: Adequate Health Literacy (09/03/2024)   See flowsheets for full screening  details  Depression Screen PHQ 2 & 9 Depression Scale- Over the past 2 weeks, how often have you been bothered by any of the following problems? Little interest or pleasure in doing things: 0 Feeling down, depressed, or hopeless (PHQ Adolescent also includes...irritable): 0 PHQ-2 Total Score: 0 Trouble falling or staying asleep, or sleeping too much: 0 Feeling tired or having little energy: 0 Poor appetite or overeating (PHQ Adolescent also includes...weight loss): 0 Feeling bad about yourself - or that you are a failure or have let yourself or your family down: 0 Trouble concentrating on things, such as reading the newspaper or watching television (PHQ Adolescent also includes...like school work): 0 Moving or speaking so slowly that other people could have noticed. Or the opposite - being so fidgety or restless that you have been moving around a lot more than usual: 0 Thoughts that you would be better off dead, or of hurting yourself in some way: 0 PHQ-9 Total Score: 0 If you checked off any problems, how difficult have these problems made it for you to do your work, take care of things at home, or get along with other people?: Not difficult at all  Depression Treatment Depression Interventions/Treatment : EYV7-0 Score <4 Follow-up Not Indicated     Goals Addressed             This Visit's Progress    Patient Stated       Better diet/2026             Objective:    Today's Vitals   09/03/24 1436  BP: (!) 141/75  Weight: 183 lb (83 kg)  Height: 6' (1.829 m)   Body mass index is 24.82 kg/m.  Hearing/Vision screen Hearing Screening -  Comments:: Denies hearing difficulties   Vision Screening - Comments:: Denies vision issues./UTD/Dr. Elspeth  Immunizations and Health Maintenance Health Maintenance  Topic Date Due   Hepatitis C Screening  Never done   COVID-19 Vaccine (3 - Pfizer risk series) 02/05/2020   Influenza Vaccine  11/26/2024 (Originally 03/29/2024)    OPHTHALMOLOGY EXAM  10/01/2024   HEMOGLOBIN A1C  01/09/2025   FOOT EXAM  07/05/2025   Diabetic kidney evaluation - eGFR measurement  07/12/2025   Diabetic kidney evaluation - Urine ACR  07/12/2025   Medicare Annual Wellness (AWV)  09/03/2025   DTaP/Tdap/Td (3 - Td or Tdap) 08/07/2028   Colonoscopy  09/30/2031   Pneumococcal Vaccine: 50+ Years  Completed   Zoster Vaccines- Shingrix   Completed   Meningococcal B Vaccine  Aged Out        Assessment/Plan:  This is a routine wellness examination for Marcus Harvey.  Patient Care Team: Knute Thersia Bitters, FNP as PCP - General (Family Medicine) Verlin Lonni BIRCH, MD as PCP - Cardiology (Cardiology)  I have personally reviewed and noted the following in the patients chart:   Medical and social history Use of alcohol, tobacco or illicit drugs  Current medications and supplements including opioid prescriptions. Functional ability and status Nutritional status Physical activity Advanced directives List of other physicians Hospitalizations, surgeries, and ER visits in previous 12 months Vitals Screenings to include cognitive, depression, and falls Referrals and appointments  No orders of the defined types were placed in this encounter.  In addition, I have reviewed and discussed with patient certain preventive protocols, quality metrics, and best practice recommendations. A written personalized care plan for preventive services as well as general preventive health recommendations were provided to patient.   Marcus Harvey Marcus Harvey, CMA   09/03/2024   Return in 1 year (on 09/03/2025).  After Visit Summary: (MyChart) Due to this being a telephonic visit, the after visit summary with patients personalized plan was offered to patient via MyChart   Nurse Notes: Patient is due for a Flu vaccine.  He is also due for a Hep C screening and can get that done during his next office visit with provider.  Patient had no other concerns to address today. "

## 2024-09-03 NOTE — Patient Instructions (Signed)
 Marcus Harvey,  Thank you for taking the time for your Medicare Wellness Visit. I appreciate your continued commitment to your health goals. Please review the care plan we discussed, and feel free to reach out if I can assist you further.  Please note that Annual Wellness Visits do not include a physical exam. Some assessments may be limited, especially if the visit was conducted virtually. If needed, we may recommend an in-person follow-up with your provider.  Ongoing Care Seeing your primary care provider every 3 to 6 months helps us  monitor your health and provide consistent, personalized care.   Last office visit on 07/05/2024.  You are due for a Flu vaccine and can get that done at the pharmacy.  You are also due for a Hep C screening and will have that done during your next office visit.    Referrals If a referral was made during today's visit and you haven't received any updates within two weeks, please contact the referred provider directly to check on the status.  Recommended Screenings:  Health Maintenance  Topic Date Due   Hepatitis C Screening  Never done   COVID-19 Vaccine (3 - Pfizer risk series) 02/05/2020   Medicare Annual Wellness Visit  07/17/2024   Flu Shot  11/26/2024*   Eye exam for diabetics  10/01/2024   Hemoglobin A1C  01/09/2025   Complete foot exam   07/05/2025   Yearly kidney function blood test for diabetes  07/12/2025   Yearly kidney health urinalysis for diabetes  07/12/2025   DTaP/Tdap/Td vaccine (3 - Td or Tdap) 08/07/2028   Colon Cancer Screening  09/30/2031   Pneumococcal Vaccine for age over 19  Completed   Zoster (Shingles) Vaccine  Completed   Meningitis B Vaccine  Aged Out  *Topic was postponed. The date shown is not the original due date.       08/30/2024    2:59 PM  Advanced Directives  Does Patient Have a Medical Advance Directive? Yes  Type of Estate Agent of [place];Living will;Out of facility DNR (pink MOST or  yellow form)  Copy of Healthcare Power of Attorney in Chart? No - copy requested    Vision: Annual vision screenings are recommended for early detection of glaucoma, cataracts, and diabetic retinopathy. These exams can also reveal signs of chronic conditions such as diabetes and high blood pressure.  Dental: Annual dental screenings help detect early signs of oral cancer, gum disease, and other conditions linked to overall health, including heart disease and diabetes.  Please see the attached documents for additional preventive care recommendations.

## 2025-01-03 ENCOUNTER — Ambulatory Visit (HOSPITAL_BASED_OUTPATIENT_CLINIC_OR_DEPARTMENT_OTHER): Admitting: Family Medicine
